# Patient Record
Sex: Male | Born: 1940 | Race: White | Hispanic: No | Marital: Married | Smoking: Never smoker
Health system: Southern US, Community
[De-identification: ages and names within clinical notes are randomized; demographics above are authoritative.]

## PROBLEM LIST (undated history)

## (undated) DIAGNOSIS — K219 Gastro-esophageal reflux disease without esophagitis: Secondary | ICD-10-CM

## (undated) DIAGNOSIS — C61 Malignant neoplasm of prostate: Secondary | ICD-10-CM

## (undated) DIAGNOSIS — F419 Anxiety disorder, unspecified: Secondary | ICD-10-CM

## (undated) DIAGNOSIS — I10 Essential (primary) hypertension: Secondary | ICD-10-CM

## (undated) DIAGNOSIS — C801 Malignant (primary) neoplasm, unspecified: Secondary | ICD-10-CM

## (undated) DIAGNOSIS — I2510 Atherosclerotic heart disease of native coronary artery without angina pectoris: Secondary | ICD-10-CM

## (undated) DIAGNOSIS — I2699 Other pulmonary embolism without acute cor pulmonale: Secondary | ICD-10-CM

## (undated) DIAGNOSIS — Z720 Tobacco use: Secondary | ICD-10-CM

## (undated) HISTORY — PX: INGUINAL HERNIA REPAIR: SUR1180

## (undated) HISTORY — PX: CHOLECYSTECTOMY: SHX55

## (undated) HISTORY — PX: OTHER SURGICAL HISTORY: SHX169

## (undated) HISTORY — DX: Malignant neoplasm of prostate: C61

## (undated) HISTORY — PX: KIDNEY STONE SURGERY: SHX686

---

## 2011-12-11 ENCOUNTER — Encounter: Payer: Self-pay | Admitting: Cardiology

## 2012-05-12 ENCOUNTER — Encounter (HOSPITAL_COMMUNITY)
Admission: EM | Disposition: A | Discharge status: Home / Self Care | Payer: Self-pay | Source: Other Acute Inpatient Hospital | Attending: Cardiology

## 2012-05-12 ENCOUNTER — Inpatient Hospital Stay (HOSPITAL_COMMUNITY)
Admission: EM | Admit: 2012-05-12 | Discharge: 2012-05-19 | DRG: 249 | Disposition: A | Discharge status: Home / Self Care | Payer: Medicare Other | Source: Other Acute Inpatient Hospital | Attending: Cardiology | Admitting: Cardiology

## 2012-05-12 ENCOUNTER — Encounter: Payer: Self-pay | Admitting: Cardiology

## 2012-05-12 ENCOUNTER — Encounter (HOSPITAL_COMMUNITY): Payer: Self-pay | Admitting: Nurse Practitioner

## 2012-05-12 DIAGNOSIS — F17200 Nicotine dependence, unspecified, uncomplicated: Secondary | ICD-10-CM | POA: Diagnosis present

## 2012-05-12 DIAGNOSIS — Z86711 Personal history of pulmonary embolism: Secondary | ICD-10-CM

## 2012-05-12 DIAGNOSIS — Z7901 Long term (current) use of anticoagulants: Secondary | ICD-10-CM

## 2012-05-12 DIAGNOSIS — I2119 ST elevation (STEMI) myocardial infarction involving other coronary artery of inferior wall: Principal | ICD-10-CM

## 2012-05-12 DIAGNOSIS — Z955 Presence of coronary angioplasty implant and graft: Secondary | ICD-10-CM

## 2012-05-12 DIAGNOSIS — Z8589 Personal history of malignant neoplasm of other organs and systems: Secondary | ICD-10-CM

## 2012-05-12 DIAGNOSIS — K219 Gastro-esophageal reflux disease without esophagitis: Secondary | ICD-10-CM | POA: Diagnosis present

## 2012-05-12 DIAGNOSIS — I2510 Atherosclerotic heart disease of native coronary artery without angina pectoris: Secondary | ICD-10-CM | POA: Diagnosis present

## 2012-05-12 DIAGNOSIS — R079 Chest pain, unspecified: Secondary | ICD-10-CM

## 2012-05-12 DIAGNOSIS — F411 Generalized anxiety disorder: Secondary | ICD-10-CM | POA: Diagnosis present

## 2012-05-12 DIAGNOSIS — I2782 Chronic pulmonary embolism: Secondary | ICD-10-CM | POA: Diagnosis present

## 2012-05-12 HISTORY — DX: Gastro-esophageal reflux disease without esophagitis: K21.9

## 2012-05-12 HISTORY — DX: Tobacco use: Z72.0

## 2012-05-12 HISTORY — PX: LEFT HEART CATH: SHX5478

## 2012-05-12 HISTORY — DX: Atherosclerotic heart disease of native coronary artery without angina pectoris: I25.10

## 2012-05-12 HISTORY — DX: Malignant (primary) neoplasm, unspecified: C80.1

## 2012-05-12 HISTORY — DX: Other pulmonary embolism without acute cor pulmonale: I26.99

## 2012-05-12 HISTORY — PX: PERCUTANEOUS CORONARY STENT INTERVENTION (PCI-S): SHX5485

## 2012-05-12 HISTORY — DX: Anxiety disorder, unspecified: F41.9

## 2012-05-12 LAB — BASIC METABOLIC PANEL
BUN: 16 mg/dL (ref 6–23)
CO2: 20 mEq/L (ref 19–32)
Calcium: 7.1 mg/dL — ABNORMAL LOW (ref 8.4–10.5)
GFR calc non Af Amer: 80 mL/min — ABNORMAL LOW (ref >90)
Glucose, Bld: 83 mg/dL (ref 70–99)

## 2012-05-12 LAB — TROPONIN I: Troponin I: 13.85 ng/mL (ref <0.30)

## 2012-05-12 LAB — CK TOTAL AND CKMB (NOT AT ARMC)
CK, MB: 56.8 ng/mL (ref 0.3–4.0)
Relative Index: 11.1 — ABNORMAL HIGH (ref 0.0–2.5)
Total CK: 514 U/L — ABNORMAL HIGH (ref 7–232)

## 2012-05-12 LAB — ABO/RH: ABO/RH(D): O POS

## 2012-05-12 LAB — MAGNESIUM: Magnesium: 1.6 mg/dL (ref 1.5–2.5)

## 2012-05-12 LAB — TYPE AND SCREEN: ABO/RH(D): O POS

## 2012-05-12 LAB — MRSA PCR SCREENING: MRSA by PCR: NEGATIVE

## 2012-05-12 SURGERY — LEFT HEART CATH
Anesthesia: LOCAL | Site: Groin | Laterality: Right

## 2012-05-12 MED ORDER — NITROGLYCERIN 0.2 MG/ML ON CALL CATH LAB
INTRAVENOUS | Status: AC
  Filled 2012-05-12: qty 1

## 2012-05-12 MED ORDER — SODIUM CHLORIDE 0.9 % IV SOLN: 250.0000 mL | INTRAVENOUS | Status: DC | PRN

## 2012-05-12 MED ORDER — ATORVASTATIN CALCIUM 80 MG PO TABS
80.0000 mg | ORAL_TABLET | Freq: Every day | ORAL | Status: DC
  Administered 2012-05-13 – 2012-05-18 (×6): 80 mg via ORAL
  Filled 2012-05-12 (×7): qty 1

## 2012-05-12 MED ORDER — ASPIRIN 81 MG PO CHEW
CHEWABLE_TABLET | ORAL | Status: AC
  Filled 2012-05-12: qty 2

## 2012-05-12 MED ORDER — LIDOCAINE HCL (PF) 1 % IJ SOLN
INTRAMUSCULAR | Status: AC
  Filled 2012-05-12: qty 30

## 2012-05-12 MED ORDER — ONDANSETRON HCL 4 MG/2ML IJ SOLN: 4.0000 mg | Freq: Four times a day (QID) | INTRAMUSCULAR | Status: DC | PRN

## 2012-05-12 MED ORDER — DOPAMINE-DEXTROSE 3.2-5 MG/ML-% IV SOLN
INTRAVENOUS | Status: AC
  Filled 2012-05-12: qty 250

## 2012-05-12 MED ORDER — MIDAZOLAM HCL 2 MG/2ML IJ SOLN
INTRAMUSCULAR | Status: AC
  Filled 2012-05-12: qty 2

## 2012-05-12 MED ORDER — ACETAMINOPHEN 325 MG PO TABS
650.0000 mg | ORAL_TABLET | ORAL | Status: DC | PRN
  Administered 2012-05-17: 650 mg via ORAL
  Filled 2012-05-12: qty 2

## 2012-05-12 MED ORDER — SODIUM CHLORIDE 0.9 % IJ SOLN
3.0000 mL | Freq: Two times a day (BID) | INTRAMUSCULAR | Status: DC
  Administered 2012-05-13 – 2012-05-18 (×12): 3 mL via INTRAVENOUS

## 2012-05-12 MED ORDER — HEPARIN (PORCINE) IN NACL 2-0.9 UNIT/ML-% IJ SOLN
INTRAMUSCULAR | Status: AC
  Filled 2012-05-12: qty 1000

## 2012-05-12 MED ORDER — SODIUM CHLORIDE 0.9 % IJ SOLN: 3.0000 mL | INTRAMUSCULAR | Status: DC | PRN

## 2012-05-12 MED ORDER — CLOPIDOGREL BISULFATE 75 MG PO TABS
75.0000 mg | ORAL_TABLET | Freq: Every day | ORAL | Status: DC
Start: 2012-05-13 — End: 2012-05-19
  Administered 2012-05-13 – 2012-05-19 (×7): 75 mg via ORAL
  Filled 2012-05-12 (×8): qty 1

## 2012-05-12 MED ORDER — ACETAMINOPHEN 325 MG PO TABS: 650.0000 mg | ORAL_TABLET | ORAL | Status: DC | PRN

## 2012-05-12 MED ORDER — BIVALIRUDIN 250 MG IV SOLR
INTRAVENOUS | Status: AC
  Filled 2012-05-12: qty 250

## 2012-05-12 MED ORDER — CLOPIDOGREL BISULFATE 300 MG PO TABS
ORAL_TABLET | ORAL | Status: AC
  Administered 2012-05-13: 75 mg via ORAL
  Filled 2012-05-12: qty 2

## 2012-05-12 MED ORDER — ALPRAZOLAM 0.5 MG PO TABS: 1.0000 mg | ORAL_TABLET | Freq: Two times a day (BID) | ORAL | Status: DC | PRN

## 2012-05-12 MED ORDER — PANTOPRAZOLE SODIUM 40 MG PO TBEC
40.0000 mg | DELAYED_RELEASE_TABLET | Freq: Every day | ORAL | Status: DC
  Administered 2012-05-14 – 2012-05-19 (×6): 40 mg via ORAL
  Filled 2012-05-12 (×7): qty 1

## 2012-05-12 MED ORDER — MORPHINE SULFATE 2 MG/ML IJ SOLN
2.0000 mg | INTRAMUSCULAR | Status: DC | PRN
  Administered 2012-05-12 – 2012-05-15 (×8): 2 mg via INTRAVENOUS
  Filled 2012-05-12 (×8): qty 1

## 2012-05-12 MED ORDER — ATROPINE SULFATE 1 MG/ML IJ SOLN
INTRAMUSCULAR | Status: AC
  Filled 2012-05-12: qty 1

## 2012-05-12 MED ORDER — ASPIRIN EC 81 MG PO TBEC
81.0000 mg | DELAYED_RELEASE_TABLET | Freq: Every day | ORAL | Status: DC
Start: 2012-05-13 — End: 2012-05-19
  Administered 2012-05-13 – 2012-05-19 (×7): 81 mg via ORAL
  Filled 2012-05-12 (×8): qty 1

## 2012-05-12 MED ORDER — NITROGLYCERIN 0.4 MG SL SUBL: 0.4000 mg | SUBLINGUAL_TABLET | SUBLINGUAL | Status: DC | PRN

## 2012-05-12 MED ORDER — ASPIRIN 81 MG PO CHEW: 81.0000 mg | CHEWABLE_TABLET | Freq: Every day | ORAL | Status: DC

## 2012-05-12 MED ORDER — SODIUM CHLORIDE 0.9 % IV SOLN
INTRAVENOUS | Status: AC
  Administered 2012-05-12: 22:00:00 via INTRAVENOUS

## 2012-05-12 NOTE — CV Procedure (Addendum)
Cardiac Catheterization Procedure Note  Name: Anthony Dodson MRN: [id number] DOB: 1941-04-20  Procedure: Left Heart Cath, Selective Coronary Angiography, LV angiography,  PTCA/Stent of RCA  Indication: inferior MI   Diagnostic Procedure Details:  The patient was prepped for a right radial.  The left could not be used due to reconstructive surgery.  The right pleth was negative with no signal in the thumb with occlusion of the radial.  The radial approach was thus abandoned.  The right femoral head was identified by fluoro and careful attention to entry technique utilized.  The right groin was prepped, draped, and anesthetized with 1% lidocaine. Using access with a SMART NEEDLE and anterior only puncture, a 6 French sheath was introduced into the right femoral artery. Standard Judkins catheters were used for selective coronary angiography. Catheter exchanges were performed over a wire.   PCI Procedure Note:  Following the diagnostic procedure, the decision was made to proceed with PCI. Marland Kitchen Weight-based bivalirudin was given for anticoagulation. ACT was not checked as the patient is on warfarin. A 6 French JR4 [facility] guide catheter was inserted.  A prowater coronary guidewire was used to cross the lesion.  The lesion was predilated with a 2.0 by 15mm balloon.  The lesion was then stented with a 2.84mm by 20 Veriflex non DES stent, as the patient is on chronic warfarin anticoagulation.  The stent was postdilated with a 3.63mm noncompliant balloon to 15 atm.  Following PCI, there was 0% residual stenosis and TIMI-3 flow. Final angiography confirmed an excellent result. Femoral hemostasis was achieved with a 49F Angioseal device after re-prepping the groin with chlorhexidine and exchaning gloves.  Femoral angio conferred an excellent insertion location before deployment.  There was excellent hemostasis. The patient tolerated the PCI procedure well. There were no immediate procedural complications.  The patient was  transferred to the post catheterization recovery area for further monitoring.  During the procedure he was hypotensive, with partial reversal with atropine, but IV fluids and dopamine were administered for presumed RV infarction.  With vigorous fluid rescucitation, the BP rose to the 130s and stabilized.  The dopamine was stopped, and fluids brought to 200cc per hour after the LVEDP demonstrated a low filling pressure.         PROCEDURAL FINDINGS Hemodynamics: AO 127/78 (100) LV 95/6/11 No gradient on pullback.    Coronary angiography: Coronary dominance: right  Left mainstem:  Calcified, very short and without obstruction  Left anterior descending (LAD): Courses to the apex.  Diffuse 50% proximal disease that is calcified and segmental.  Then there is an 80% focal lesion.  Distally there is a 70% segmental area of disease.  The distal LAD wraps the apex.    Left circumflex (LCx): provides a large OM1 with 95% ostial narrowing, and large AV circ that supplies a large second OM. The latter is without significant focal narrowing.    Right coronary artery (RCA): totally occluded just beyond the ostium.  Following opening there was a tight lesion proximally that was stented with a non DES stent, 20mm in length, with a 2.75 stent taken to 3.3, with restoration of TIMI3 flow from TIMI 0 flow.  The lesion was reduced to 0%.  The RCA had a moderate PDA, smaller PLA 1 and very large PLA 2 that coursed well out to the apical lateral wall.  The PDA had segmental 40% proximally, and the AV groove continuation branch hasd 30% narrowing.  There was some collateralization prior to reperfusion.  Left ventriculography: Left ventricular systolic function is mildly reduced with moderate inferior hypokinesis. LVEF is estimated at 50%, and there is no significant mitral regurgitation.  Femoral angiography revealed no significant atherosclerotic change.      PCI Data: Vessel - RCA/Segment - 1 Percent  Stenosis (pre)  100 TIMI-flow 0 Stent Veriflex non DES 2.74mm by 20mm post dil to 3.30mm Percent Stenosis (post) 0 TIMI-flow (post) 3  Final Conclusions:   1.  Inferior wall MI with probable RV infarction due to total proximal occlusion of the RCA with successful myocardial reperfusion. 2.  Residual significant disease of the LAD and OM1 as noted.    Recommendations:  1.  ASA/Plavix 2.. Plavix and warfarin to continue.  Start warfarin back when INR is 1.6 to 1.8 3.  Review films with team to discuss options of treatment.  May need PCI or CABG.    Trisha Morandi 05/12/2012, 9:05 PM

## 2012-05-12 NOTE — H&P (Signed)
Patient ID: Anthony Dodson MRN: [id number], DOB/AGE: 1941/02/04   Admit date: 05/12/2012   Primary Physician: Juanito Doom - [place] [facility], [facility] Primary Cardiologist: New to [place] - likely to f/u in [place].  Pt. Profile:  71 y/o male w/o prior cardiac hx who presents with acute inferior STEMI.  Problem List  Past Medical History  Diagnosis Date  . 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 GERD (gastroesophageal reflux disease)   . Anxiety   . Cancer     a. 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   Allergies  Allergies no known allergies  HPI  71 y/o male without prior cardiac hx.  As noted above, he does have a h/o recurrent PE and is chronic coumadin anticoagulation.  He was in his USOH until this evening @ approx 17:30 when he was out in his yard and had sudden onset of severe chest tightness associated with sob and nausea.  He had trouble making it back to his house but when he did, he says he could barely breathe.  His fiancee called EMS and he was taken to [facility] ED.  There, he was found to have inferior ST elevation with reciprocal changes.  Code STEMI was called.  Labs were sent off - INR 2.5, creat 1.35.  Initial CE were negative.  He was transported to [facility] for further eval.  Upon arrival to the cath lab, he complains of 6/10 chest pain.  He is hemodynamically stable and cath is ongoing.  Home Medications  Pt says that he takes:  -Xanax 1mg  qhs and occasionally during the day on a prn basis. -coumadin -prilosec  Family History  Family History  Problem Relation Age of Onset  . Lung disease Father     died in his 38's of black lung  . Stroke Mother     died in her 43's.   Social History  History   Social History  . Marital Status: Divorced    Spouse Name: N/A    Number of Children: N/A  . Years of Education: N/A   Occupational History  . Not on file.   Social  History Main Topics  . Smoking status: Never Smoker   . 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 Alcohol Use: No  . Drug Use: No  . Sexually Active: Yes   Other Topics Concern  . Not on file   Social History Narrative   Lives in [place], [place] with fiance.  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     Review of Systems General:  No chills, fever, night sweats or weight changes.  Cardiovascular:  +++ chest pain, dyspnea, and nausea as outlined above.  No edema, orthopnea, palpitations, paroxysmal nocturnal dyspnea. Dermatological: No rash, lesions/masses Respiratory: No cough, dyspnea Urologic: No hematuria, dysuria Abdominal:   No nausea, vomiting, diarrhea, bright red blood per rectum, melena, or hematemesis Neurologic:  No visual changes, wkns, changes in mental status. All other systems reviewed and are otherwise negative except as noted above.  Physical Exam  There were no vitals taken for this visit.  General: Pleasant, NAD Psych: Normal affect. Neuro: Alert and oriented X 3. Moves all extremities spontaneously. HEENT: Normal  Neck: Supple without bruits or JVD. Lungs:  Resp regular and unlabored, CTA. Heart: RRR no s3, s4,  or murmurs. Abdomen: Soft, non-tender, non-distended, BS + x 4.  Extremities: No clubbing, cyanosis or edema. DP/PT/Radials 2+ and equal bilaterally.  Reverse Allen's on right arm is abnl with NO ulnar pleth waveform noted.  Labs  All labs pending.   Radiology/Studies  No results found.  ECG  SB, 59, 1mm inf st elevation with ant/lat st dep/t changes.  ASSESSMENT AND PLAN  1.  Acute inferior STEMI/CAD:  Cath/PCI ongoing.  Preliminarily shows occluded proximal RCA.  Plan to add asa, p2y12 inhibitor, bb (as tolerated - currently experiencing intermittent bradycardia req atropine), high dose statin.  Eventual cardiac rehab.  Pt had no right ulnar waveform and thus femoral  access was obtained.  Will have to watch for bleeding closely in setting of therapeutic INR.  2.  GERD:  Add protonix.  3.  Lipids:  Unknown status.  Check lipids/lft's.  Add high dose statin.  4.  Recurrent Pulmonary Emboli:  Will need to be placed back on coumadin ASAP.  INR currently therapeutic.  Signed, Nicolasa Ducking, NP 05/12/2012, 7:52 PM  History as noted above.  Presentation with acute inferior MI.  Repeat ECG done here to confirm need.  ECG here consistent with acute MI.  Patient seen and with continued pain cath recommended.  History reviewed in detail and patient examined.  Pleth on right revealed no flow to thumb with radial occluded.  Unable to use left arm due to reconstructive surgery.  Therefore femoral approach utilized.

## 2012-05-12 NOTE — Progress Notes (Signed)
Chaplain Note:  Chaplain responded to page to Cath Lab team.  Chaplain provided spiritual comfort and support for pt and staff.  There was no family present.  Pt and staff expressed appreciation for chaplain support.  Chaplain will follow up as needed.  05/12/12 2214  Clinical Encounter Type  Visited With Patient  Visit Type Spiritual support  Referral From Other (Comment) (Code STEMI page)  Spiritual Encounters  Spiritual Needs Emotional  Stress Factors  Patient Stress Factors Health changes;Major life changes  Family Stress Factors None identified (No family present)  Advance Directives (For Healthcare)  Advance Directive Patient has advance directive, copy not in chart  Advance Directive not in Chart Copy requested from family  Pre-existing out of facility DNR order (yellow form or pink MOST form) No   Verdie Shire, Chaplain  [phone]

## 2012-05-13 DIAGNOSIS — Z8589 Personal history of malignant neoplasm of other organs and systems: Secondary | ICD-10-CM

## 2012-05-13 DIAGNOSIS — I2782 Chronic pulmonary embolism: Secondary | ICD-10-CM | POA: Diagnosis present

## 2012-05-13 DIAGNOSIS — I219 Acute myocardial infarction, unspecified: Secondary | ICD-10-CM

## 2012-05-13 DIAGNOSIS — I2119 ST elevation (STEMI) myocardial infarction involving other coronary artery of inferior wall: Secondary | ICD-10-CM | POA: Diagnosis present

## 2012-05-13 LAB — BASIC METABOLIC PANEL
CO2: 23 mEq/L (ref 19–32)
Chloride: 108 mEq/L (ref 96–112)
Creatinine, Ser: 1.19 mg/dL (ref 0.50–1.35)
GFR calc Af Amer: 69 mL/min — ABNORMAL LOW (ref >90)
Potassium: 4.6 mEq/L (ref 3.5–5.1)

## 2012-05-13 LAB — LIPID PANEL
LDL Cholesterol: 60 mg/dL (ref 0–99)
VLDL: 57 mg/dL — ABNORMAL HIGH (ref 0–40)

## 2012-05-13 LAB — CBC
MCV: 93.5 fL (ref 78.0–100.0)
Platelets: 165 10*3/uL (ref 150–400)
RBC: 4.14 MIL/uL — ABNORMAL LOW (ref 4.22–5.81)
WBC: 7.8 10*3/uL (ref 4.0–10.5)

## 2012-05-13 LAB — PROTIME-INR
INR: 2.79 — ABNORMAL HIGH (ref 0.00–1.49)
Prothrombin Time: 28 seconds — ABNORMAL HIGH (ref 11.6–15.2)

## 2012-05-13 LAB — CK TOTAL AND CKMB (NOT AT ARMC)
CK, MB: 252.6 ng/mL (ref 0.3–4.0)
CK, MB: 224.9 ng/mL (ref 0.3–4.0)
Relative Index: 12.7 — ABNORMAL HIGH (ref 0.0–2.5)
Relative Index: 11.6 — ABNORMAL HIGH (ref 0.0–2.5)

## 2012-05-13 LAB — HEMOGLOBIN A1C: Mean Plasma Glucose: 128 mg/dL — ABNORMAL HIGH (ref <117)

## 2012-05-13 MED ORDER — PERFLUTREN LIPID MICROSPHERE
1.0000 mL | INTRAVENOUS | Status: AC | PRN
  Administered 2012-05-13: 2 mL via INTRAVENOUS
  Filled 2012-05-13: qty 10

## 2012-05-13 MED ORDER — PERFLUTREN LIPID MICROSPHERE
INTRAVENOUS | Status: AC
  Administered 2012-05-13: 2 mL via INTRAVENOUS
  Filled 2012-05-13: qty 10

## 2012-05-13 MED FILL — Dextrose Inj 5%: INTRAVENOUS | Qty: 50 | Status: AC

## 2012-05-13 NOTE — Progress Notes (Signed)
Echocardiogram 2D Echocardiogram has been performed.  Anthony Dodson 05/13/2012, 11:34 AM

## 2012-05-13 NOTE — Progress Notes (Signed)
Subjective:  No chest pain.  Feeling ok.  Groin ok.    Objective:  Vital Signs in the last 24 hours: Temp:  [98 F (36.7 C)-98.2 F (36.8 C)] 98 F (36.7 C) (10/02 0400) Pulse Rate:  [50-92] 51  (10/02 0700) Resp:  [11-18] 18  (10/02 0700) BP: (92-113)/(46-77) 99/46 mmHg (10/02 0700) SpO2:  [96 %-100 %] 98 % (10/02 0700) Weight:  [172 lb 9.9 oz (78.3 kg)] 172 lb 9.9 oz (78.3 kg) (10/01 2211)  Intake/Output from previous day: 10/01 0701 - 10/02 0700 In: 1000 [I.V.:1000] Out: 450 [Urine:450]   Physical Exam: General: Well developed, well nourished, in no acute distress. Head:  Normocephalic and atraumatic. Lungs: Clear to auscultation and percussion. Heart: Normal S1 and S2.  S4 gallop Pulses: Pulses normal in all 4 extremities.  Angioseal site clean and without hematoma Extremities: No clubbing or cyanosis. No edema. Neurologic: Alert and oriented x 3.    Lab Results:  [facility] 05/13/12 0311  WBC 7.8  HGB 12.5*  PLT 165    Basename 05/13/12 0311 05/12/12 2143  NA 140 143  K 4.6 3.5  CL 108 115*  CO2 23 20  GLUCOSE 111* 83  BUN 18 16  CREATININE 1.19 0.99    Basename 05/13/12 0311 05/12/12 2144  TROPONINI >20.00* 13.85*   Hepatic Function Panel No results found for this basename: PROT,ALBUMIN,AST,ALT,ALKPHOS,BILITOT,BILIDIR,IBILI in the last 72 hours  Basename 05/13/12 0311  CHOL 141   No results found for this basename: PROTIME in the last 72 hours  Imaging: No results found.  EKG:  Inferior MI, age uncertain.  STE resolved.    Cardiac Studies:  See troponins  Assessment/Plan:  1.  Inferior MI, sp PCI 2.  History of multiple PEs in past 3.  Prior head and neck cancer.  4.  Elevated INR due to warfarin. 5.  Multiple vessel CAD.    Plan 1.  Hold warfarin and allow INR to drift down. 2.  Restart heparin when less than 2.0 3.  Consider other options with regard to treatment of multivessel CAD  Need more information regarding cancer.  Will try  to get records from [facility].        Shawnie Pons, MD, [facility], FSCAI 05/13/2012, 8:17 AM

## 2012-05-13 NOTE — Progress Notes (Signed)
[id number] Came to see pt. Was going to get OOB to chair but pt stated that he was to remain in bed. Stated cardiologist wanted him to keep groin quiet due to coumadin. Began ed with pt and fiancee. Reviewed MI restrictions, NTG use and stent. Left MI booklet. Will followup tomorrow for activity. Please address activity. Tyrian Peart DunlapRN

## 2012-05-13 NOTE — Care Management Note (Signed)
  Page 1 of 1   05/13/2012     4:45:33 PM   CARE MANAGEMENT NOTE 05/13/2012  Patient:  Anthony Dodson, Anthony Dodson   Account Number:  [id number]  Date Initiated:  05/13/2012  Documentation initiated by:  Alvira Philips Assessment:   71 yr-old male adm with dx of STEMI; lives with fiance, independent PTA      DC Planning Services  CM consult   Per UR Regulation:  Reviewed for med. necessity/level of care/duration of stay  Comments:  Primary care with Quentin Mulling NP @ [facility]

## 2012-05-14 DIAGNOSIS — Z8589 Personal history of malignant neoplasm of other organs and systems: Secondary | ICD-10-CM

## 2012-05-14 MED ORDER — ALPRAZOLAM 0.5 MG PO TABS
0.5000 mg | ORAL_TABLET | Freq: Three times a day (TID) | ORAL | Status: DC | PRN
  Administered 2012-05-14 – 2012-05-18 (×5): 0.5 mg via ORAL
  Filled 2012-05-14 (×5): qty 1

## 2012-05-14 NOTE — Progress Notes (Signed)
Subjective:  Apparently sees a Dr. Arlyn Leak in [place] due to history of PE.  We do not have any data surrounding his MI  Objective:  Vital Signs in the last 24 hours: Temp:  [98.5 F (36.9 C)-99.5 F (37.5 C)] 98.9 F (37.2 C) (10/03 0800) Pulse Rate:  [52-70] 64  (10/03 0800) Resp:  [7-19] 14  (10/03 0800) BP: (91-119)/(52-70) 119/67 mmHg (10/03 0800) SpO2:  [91 %-97 %] 96 % (10/03 0800) Weight:  [173 lb 15.1 oz (78.9 kg)] 173 lb 15.1 oz (78.9 kg) (10/03 0500)  Intake/Output from previous day: 10/02 0701 - 10/03 0700 In: 1320 [P.O.:1320] Out: 2300 [Urine:2300]   Physical Exam: General: Well developed, well nourished, in no acute distress. Head:  Normocephalic .  Prior neck surgery.   Lungs: Clear to auscultation and percussion. Heart: Normal S1 and S2.  No murmur, rubs or gallops.  Pulses: Pulses normal in all 4 extremities.  Groin looks great.   Extremities: No clubbing or cyanosis. No edema. Neurologic: Alert and oriented x 3.    Lab Results:  [facility] 05/13/12 0311  WBC 7.8  HGB 12.5*  PLT 165    Basename 05/13/12 0311 05/12/12 2143  NA 140 143  K 4.6 3.5  CL 108 115*  CO2 23 20  GLUCOSE 111* 83  BUN 18 16  CREATININE 1.19 0.99    Basename 05/13/12 0915 05/13/12 0311  TROPONINI >20.00* >20.00*   Hepatic Function Panel No results found for this basename: PROT,ALBUMIN,AST,ALT,ALKPHOS,BILITOT,BILIDIR,IBILI in the last 72 hours  Basename 05/13/12 0311  CHOL 141   No results found for this basename: PROTIME in the last 72 hours  Imaging: No results found.    Assessment/Plan:  Patient Active Hospital Problem List: ST elevation myocardial infarction (STEMI) of inferoposterior wall (05/13/2012)   Assessment: slow improvement overall   Plan: begin to ambulate and move forward.  Will defer decision on second lesions depending on INR Chronic pulmonary embolism (05/13/2012)   Assessment: continue heparin when INR less than 2   Plan: repeat INR  tomorrow History of head or neck cancer (05/13/2012)   Assessment: attempt to contact Dr. Arlyn Leak regarding HN CA   Plan: as above.        Shawnie Pons, MD, [facility], FSCAI 05/14/2012, 9:14 AM

## 2012-05-14 NOTE — Progress Notes (Signed)
CARDIAC REHAB PHASE I   PRE:  Rate/Rhythm: 58 SB    BP: sitting 116/68    SaO2:   MODE:  Ambulation: 140 ft   POST:  Rate/Rhythm: 70 SR    BP: sitting 121/31     SaO2: 96 RA  Pt c/o groin soreness. Able to stand independently and walk with RW slowly/gingerly. Denied CP/SOB. Return to recliner after fairly short distance. VSS. Will f/u tomorrow.  [id number]  Harriet Masson CES, ACSM

## 2012-05-15 LAB — CBC
HCT: 38.7 % — ABNORMAL LOW (ref 39.0–52.0)
Hemoglobin: 12.6 g/dL — ABNORMAL LOW (ref 13.0–17.0)
MCH: 30.2 pg (ref 26.0–34.0)
MCHC: 32.6 g/dL (ref 30.0–36.0)
RBC: 4.17 MIL/uL — ABNORMAL LOW (ref 4.22–5.81)

## 2012-05-15 LAB — BASIC METABOLIC PANEL
BUN: 18 mg/dL (ref 6–23)
CO2: 25 mEq/L (ref 19–32)
GFR calc non Af Amer: 56 mL/min — ABNORMAL LOW (ref >90)
Glucose, Bld: 99 mg/dL (ref 70–99)
Potassium: 3.8 mEq/L (ref 3.5–5.1)
Sodium: 143 mEq/L (ref 135–145)

## 2012-05-15 LAB — PROTIME-INR
INR: 1.73 — ABNORMAL HIGH (ref 0.00–1.49)
Prothrombin Time: 19.7 seconds — ABNORMAL HIGH (ref 11.6–15.2)

## 2012-05-15 MED ORDER — WARFARIN - PHARMACIST DOSING INPATIENT: Freq: Every day | Status: DC

## 2012-05-15 MED ORDER — WARFARIN SODIUM 6 MG PO TABS
6.0000 mg | ORAL_TABLET | Freq: Once | ORAL | Status: AC
  Administered 2012-05-15: 6 mg via ORAL
  Filled 2012-05-15: qty 1

## 2012-05-15 NOTE — Progress Notes (Signed)
   SUBJECTIVE:  No chest pain.  No SOB   PHYSICAL EXAM Filed Vitals:   05/14/12 1135 05/14/12 1357 05/14/12 2100 05/15/12 0500  BP: 138/83 116/72 119/72 105/67  Pulse: 61 63 60 58  Temp: 98.5 F (36.9 C) 98.7 F (37.1 C) 98.3 F (36.8 C) 98.2 F (36.8 C)  TempSrc:      Resp: 18 18 18 18   Height:      Weight:    78.926 kg (174 lb)  SpO2: 98% 96% 95% 96%   General:  No distress Lungs:  Clear Heart:  RRR Abdomen:   Positive bowel sounds, no rebound no guarding Extremities:  Right groin without hematoma.  LABS: Lab Results  Component Value Date   CKTOTAL 1945* 05/13/2012   CKMB 224.9* 05/13/2012   TROPONINI >20.00* 05/13/2012   Results for orders placed during the hospital encounter of 05/12/12 (from the past 24 hour(s))  PROTIME-INR     Status: Abnormal   Collection Time   05/15/12  5:15 AM      Component Value Range   Prothrombin Time 19.7 (*) 11.6 - 15.2 seconds   INR 1.73 (*) 0.00 - 1.49  CBC     Status: Abnormal   Collection Time   05/15/12  5:15 AM      Component Value Range   WBC 6.7  4.0 - 10.5 K/uL   RBC 4.17 (*) 4.22 - 5.81 MIL/uL   Hemoglobin 12.6 (*) 13.0 - 17.0 g/dL   HCT 40.9 (*) 81.1 - 91.4 %   MCV 92.8  78.0 - 100.0 fL   MCH 30.2  26.0 - 34.0 pg   MCHC 32.6  30.0 - 36.0 g/dL   RDW 78.2  95.6 - 21.3 %   Platelets 150  150 - 400 K/uL  BASIC METABOLIC PANEL     Status: Abnormal   Collection Time   05/15/12  5:15 AM      Component Value Range   Sodium 143  135 - 145 mEq/L   Potassium 3.8  3.5 - 5.1 mEq/L   Chloride 108  96 - 112 mEq/L   CO2 25  19 - 32 mEq/L   Glucose, Bld 99  70 - 99 mg/dL   BUN 18  6 - 23 mg/dL   Creatinine, Ser 0.86  0.50 - 1.35 mg/dL   Calcium 9.1  8.4 - 57.8 mg/dL   GFR calc non Af Amer 56 (*) >90 mL/min   GFR calc Af Amer 65 (*) >90 mL/min    Intake/Output Summary (Last 24 hours) at 05/15/12 0819 Last data filed at 05/15/12 0814  Gross per 24 hour  Intake    720 ml  Output    500 ml  Net    220 ml    EKG:     ASSESSMENT AND PLAN:  ST elevation myocardial infarction (STEMI) of inferoposterior wall:  Discussed with Dr. Riley Kill.  Medical management of remaining lesions.  I will restart warfarin.  OK to discharge in AM.  I will follow him in [place].    Chronic pulmonary embolism:  Restart warfarin as above  History of head or neck cancer:  He will follow with his primary provider.    Fayrene Fearing [facility] 05/15/2012 8:19 AM

## 2012-05-15 NOTE — Progress Notes (Signed)
CARDIAC REHAB PHASE I   PRE:  Rate/Rhythm: 57 SB    BP: sitting 110/60    SaO2:   MODE:  Ambulation: 510 ft   POST:  Rate/Rhythm: 83 SR    BP: sitting 94/50     SaO2:   Tolerated well. Groin feels better this pm. Able to walk without RW without problems. No CP, etc. BP lower after walk but denied dizziness. Ed completed. Pt loves sugar. Discussed this. Requests his name be sent to [facility] but does seem reluctant to do program.  [id number]  Harriet Masson CES, ACSM

## 2012-05-15 NOTE — Progress Notes (Signed)
ANTICOAGULATION CONSULT NOTE - Initial Consult  Pharmacy Consult for coumadin  Indication: pulmonary embolus  No Known Allergies  Patient Measurements: Height: 5\' 10"  (177.8 cm) Weight: 174 lb (78.926 kg) IBW/kg (Calculated) : 73  Heparin Dosing Weight:  Vital Signs: Temp: 98.2 F (36.8 C) (10/04 0500) BP: 105/67 mmHg (10/04 0500) Pulse Rate: 58  (10/04 0500)  Labs:  Alvira Philips 05/15/12 0515 05/14/12 0508 05/13/12 0915 05/13/12 0311 05/12/12 2144 05/12/12 2143  HGB 12.6* -- -- 12.5* -- --  HCT 38.7* -- -- 38.7* -- --  PLT 150 -- -- 165 -- --  APTT -- -- -- -- -- --  LABPROT 19.7* 25.0* -- 28.0* -- --  INR 1.73* 2.39* -- 2.79* -- --  HEPARINUNFRC -- -- -- -- -- --  CREATININE 1.25 -- -- 1.19 -- 0.99  CKTOTAL -- -- 1945* 1988* 514* --  CKMB -- -- 224.9* 252.6* 56.8* --  TROPONINI -- -- >20.00* >20.00* 13.85* --    Estimated Creatinine Clearance: 55.2 ml/min (by C-G formula based on Cr of 1.25).   Medical History: Past Medical History  Diagnosis Date  . 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 GERD (gastroesophageal reflux disease)   . Anxiety   . Cancer     a. 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 ALPRAZolam (XANAX) 1 MG tablet Take 1 mg by mouth 2 (two) times daily as needed. For anxiety      . Cholecalciferol (VITAMIN D PO) Take 1 tablet by mouth daily.      Marland Kitchen GLUCOSAMINE PO Take 1 tablet by mouth daily. Hold while in hospital      . omeprazole (PRILOSEC) 40 MG capsule Take 40 mg by mouth daily.      . vitamin B-12 (CYANOCOBALAMIN) 1000 MCG tablet Take 1,000 mcg by mouth daily.      Marland Kitchen warfarin (COUMADIN) 4 MG tablet Take 4 mg by mouth daily.        Assessment:  71 yo man on coumadin for hx of recurrent PE, last 2012 admitted for MI.  He is s/p cath with successful stenting of RCA, and plan is for medical management of LAD/OM1.  Coumadin to be resumed today for hx PE.   His home dose of coumadin is 4 mg po daily.  His INR is 1.73 today after coumadin was held the past 2 days.  His INR was therapeutic at 2.79 on admission.   Goal of Therapy:  INR 2-3   Plan:  1. Coumadin 6 mg po x 1dose 2. Home dose is 4 mg po daily 3. Daily INR Herby Abraham, Pharm.D. [phone] 05/15/2012 11:07 AM

## 2012-05-15 NOTE — Progress Notes (Signed)
0910 Pt declined walk at this time. Stated he went to bathroom and groin now hurting. RN giving pain med now. Pt asked that we come back. Will followup after 1300 as fiancee should be here. Sequoya Hogsett DunlapRN

## 2012-05-16 LAB — PROTIME-INR
INR: 1.31 (ref 0.00–1.49)
Prothrombin Time: 16 seconds — ABNORMAL HIGH (ref 11.6–15.2)

## 2012-05-16 LAB — CBC
Hemoglobin: 12.8 g/dL — ABNORMAL LOW (ref 13.0–17.0)
RBC: 4.24 MIL/uL (ref 4.22–5.81)

## 2012-05-16 MED ORDER — ENOXAPARIN SODIUM 40 MG/0.4ML ~~LOC~~ SOLN
40.0000 mg | SUBCUTANEOUS | Status: DC
  Administered 2012-05-16 – 2012-05-19 (×4): 40 mg via SUBCUTANEOUS
  Filled 2012-05-16 (×5): qty 0.4

## 2012-05-16 MED ORDER — WARFARIN SODIUM 6 MG PO TABS
6.0000 mg | ORAL_TABLET | Freq: Once | ORAL | Status: AC
  Administered 2012-05-16: 6 mg via ORAL
  Filled 2012-05-16: qty 1

## 2012-05-16 NOTE — Progress Notes (Signed)
Subjective:  Stable.  No new symptoms.  Unfortunately, INR is down--hx of three recurrent PEs.  His girlfriend told us he still chews so we discussed this in detail.    Objective:  Vital Signs in the last 24 hours: Temp:  [98.1 F (36.7 C)-98.5 F (36.9 C)] 98.1 F (36.7 C) (10/05 0600) Pulse Rate:  [56-64] 56  (10/05 0600) Resp:  [15] 15  (10/05 0600) BP: (96-130)/(50-74) 96/50 mmHg (10/05 0600) SpO2:  [97 %] 97 % (10/05 0600)  Intake/Output from previous day: 10/04 0701 - 10/05 0700 In: 840 [P.O.:840] Out: -    Physical Exam: General: Well developed, well nourished, in no acute distress. Head:  Normocephalic.  Left facial surgery.   Lungs: Clear to auscultation and percussion. Heart: Normal S1 and S2.  No murmur, rubs or gallops.  Pulses: Pulses normal in all 4 extremities. Extremities: No clubbing or cyanosis. No edema. Neurologic: Alert and oriented x 3.    Lab Results:  Basename 05/16/12 0600 05/15/12 0515  WBC 6.4 6.7  HGB 12.8* 12.6*  PLT 158 150    Basename 05/15/12 0515  NA 143  K 3.8  CL 108  CO2 25  GLUCOSE 99  BUN 18  CREATININE 1.25   No results found for this basename: TROPONINI:2,CK,MB:2 in the last 72 hours Hepatic Function Panel No results found for this basename: PROT,ALBUMIN,AST,ALT,ALKPHOS,BILITOT,BILIDIR,IBILI in the last 72 hours No results found for this basename: CHOL in the last 72 hours No results found for this basename: PROTIME in the last 72 hours  Imaging: No results found.  INR 1.3  Assessment/Plan:  Patient Active Hospital Problem List: ST elevation myocardial infarction (STEMI) of inferoposterior wall (05/13/2012)   Assessment: much improved   Plan: has residual CAD but not ideal for PCI.  Dr. Excell Seltzer and I have reviewed films, and it looks best for now to consider primarily medical therapy Chronic pulmonary embolism (05/13/2012)   Assessment: will delay dc and give sub q lower dose lovenox  (because of recent femoral  cath), and give warfarin again.  Hopefully INR will go back up tomorrow.    Plan: as above History of head or neck cancer (05/13/2012)   Assessment: still using smokeless tobacco   Plan: Needs follow up and lives ten minutes from [facility] so will get him to get a follow up in cancer center at [facility] when he sees Dr. Antoine Poche in follow up.        Shawnie Pons, MD, [facility], FSCAI 05/16/2012, 9:57 AM

## 2012-05-16 NOTE — Progress Notes (Signed)
ANTICOAGULATION CONSULT NOTE - Follow Up Consult  Pharmacy Consult for Warfarin Indication: VTE prophylaxis, recurrent PE's  No Known Allergies  Patient Measurements: Height: 5\' 10"  (177.8 cm) Weight: 174 lb (78.926 kg) IBW/kg (Calculated) : 73   Vital Signs: Temp: 98.1 F (36.7 C) (10/05 0600) BP: 96/50 mmHg (10/05 0600) Pulse Rate: 56  (10/05 0600)  Labs:  Basename 05/16/12 0600 05/15/12 0515 05/14/12 0508  HGB 12.8* 12.6* --  HCT 39.0 38.7* --  PLT 158 150 --  APTT -- -- --  LABPROT 16.0* 19.7* 25.0*  INR 1.31 1.73* 2.39*  HEPARINUNFRC -- -- --  CREATININE -- 1.25 --  CKTOTAL -- -- --  CKMB -- -- --  TROPONINI -- -- --    Estimated Creatinine Clearance: 55.2 ml/min (by C-G formula based on Cr of 1.25).   Medications:  Scheduled:    . aspirin EC  81 mg Oral Daily  . atorvastatin  80 mg Oral q1800  . clopidogrel  75 mg Oral Q breakfast  . enoxaparin (LOVENOX) injection  40 mg Subcutaneous Q24H  . pantoprazole  40 mg Oral Q0600  . sodium chloride  3 mL Intravenous Q12H  . warfarin  6 mg Oral ONCE-1800  . 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 His home dose of warfarin is 4mg  daily and his INR on admit was 2.79. INR was 1.73 after two days of holding warfarin. Warfarin was restarted on 10/4 with a boosted dose (6mg ) of warfarin and INR today was 1.31. Will provide another slightly boosted dose of warfarin today to make up for the two held days. Per Cardiology, a low dose of lovenox was also started. H/H stable. No evidence of bleeding reported.  Goal of Therapy:  INR 2-3 Monitor platelets by anticoagulation protocol: Yes   Plan:  -Warfarin 6mg  PO x 1 at 1800 -Daily PT/INR -Monitor for bleeding, especially at cath site -D/C lovenox when indicated  Abran Duke, PharmD Clinical Pharmacist Phone: [phone] Pager:  [phone] 05/16/2012 11:52 AM

## 2012-05-16 NOTE — Progress Notes (Signed)
Visit to patient for ambulation for Cardiac Rehab Phase 1.  Patient does not want to walk at this time, states he has walked independently today twice and plans to walk later today with his family.  Just anxious to go home.

## 2012-05-17 DIAGNOSIS — Z9861 Coronary angioplasty status: Secondary | ICD-10-CM

## 2012-05-17 LAB — BASIC METABOLIC PANEL
CO2: 26 mEq/L (ref 19–32)
GFR calc non Af Amer: 50 mL/min — ABNORMAL LOW (ref >90)
Glucose, Bld: 103 mg/dL — ABNORMAL HIGH (ref 70–99)
Potassium: 4.2 mEq/L (ref 3.5–5.1)
Sodium: 141 mEq/L (ref 135–145)

## 2012-05-17 LAB — PROTIME-INR
INR: 1.27 (ref 0.00–1.49)
Prothrombin Time: 15.6 seconds — ABNORMAL HIGH (ref 11.6–15.2)

## 2012-05-17 LAB — CBC
Hemoglobin: 13.3 g/dL (ref 13.0–17.0)
RBC: 4.38 MIL/uL (ref 4.22–5.81)

## 2012-05-17 MED ORDER — METOPROLOL SUCCINATE 12.5 MG HALF TABLET
12.5000 mg | ORAL_TABLET | Freq: Every day | ORAL | Status: DC
  Administered 2012-05-17 – 2012-05-19 (×3): 12.5 mg via ORAL
  Filled 2012-05-17 (×3): qty 1

## 2012-05-17 MED ORDER — WARFARIN SODIUM 6 MG PO TABS
6.0000 mg | ORAL_TABLET | Freq: Once | ORAL | Status: AC
  Administered 2012-05-17: 6 mg via ORAL
  Filled 2012-05-17: qty 1

## 2012-05-17 NOTE — Progress Notes (Addendum)
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  Feels fine.  Ambulating without difficulty.  CURRENT MEDS    . aspirin EC  81 mg Oral Daily  . atorvastatin  80 mg Oral q1800  . clopidogrel  75 mg Oral Q breakfast  . enoxaparin (LOVENOX) injection  40 mg Subcutaneous Q24H  . pantoprazole  40 mg Oral Q0600  . sodium chloride  3 mL Intravenous Q12H  . warfarin  6 mg Oral ONCE-1800  . warfarin  6 mg Oral ONCE-1800  . Warfarin - Pharmacist Dosing Inpatient   Does not apply q1800    OBJECTIVE  Filed Vitals:   05/16/12 0600 05/16/12 1400 05/16/12 2130 05/17/12 0600  BP: 96/50 113/68 125/75 100/61  Pulse: 56 52 53 61  Temp: 98.1 F (36.7 C) 98.1 F (36.7 C) 98.8 F (37.1 C) 98.3 F (36.8 C)  TempSrc:      Resp: 15 17 16 15   Height:      Weight:      SpO2: 97% 97% 98% 96%    Intake/Output Summary (Last 24 hours) at 05/17/12 1045 Last data filed at 05/17/12 0819  Gross per 24 hour  Intake    680 ml  Output      0 ml  Net    680 ml   Filed Weights   05/12/12 2211 05/14/12 0500 05/15/12 0500  Weight: 172 lb 9.9 oz (78.3 kg) 173 lb 15.1 oz (78.9 kg) 174 lb (78.926 kg)    PHYSICAL EXAM  General: Pleasant, NAD. Neuro: Alert and oriented X 3. Moves all extremities spontaneously. Psych: Normal affect. HEENT:  Normal except HN deformity due to surgery  Neck: Supple without bruits or JVD. Lungs:  Resp regular and unlabored, CTA. Heart: RRR no s3, s4, or murmurs. Abdomen: Soft, non-tender, non-distended, BS + x 4.  Extremities: No clubbing, cyanosis or edema. DP/PT/Radials 2+ and equal bilaterally.  Accessory Clinical Findings  CBC  Basename 05/17/12 0545 05/16/12 0600  WBC 6.5 6.4  NEUTROABS -- --  HGB 13.3 12.8*  HCT 40.7 39.0  MCV 92.9 92.0  PLT 168 158   Basic  Metabolic Panel  Basename 05/17/12 0545 05/15/12 0515  NA 141 143  K 4.2 3.8  CL 105 108  CO2 26 25  GLUCOSE 103* 99  BUN 21 18  CREATININE 1.37* 1.25  CALCIUM 9.5 9.1  MG -- --  PHOS -- --   Liver Function Tests No results found for this basename: AST:2,ALT:2,ALKPHOS:2,BILITOT:2,PROT:2,ALBUMIN:2 in the last 72 hours No results found for this basename: LIPASE:2,AMYLASE:2 in the last 72 hours Cardiac Enzymes No results found for this basename: CKTOTAL:3,CKMB:3,CKMBINDEX:3,TROPONINI:3 in the last 72 hours BNP No components found with this basename: POCBNP:3 D-Dimer No results found for this basename: DDIMER:2 in the last 72 hours Hemoglobin A1C No results found for this basename: HGBA1C in the last 72 hours Fasting Lipid Panel No results found for this basename: CHOL,HDL,LDLCALC,TRIG,CHOLHDL,LDLDIRECT in the last 72 hours Thyroid Function Tests No results found for this basename: TSH,T4TOTAL,FREET3,T3FREE,THYROIDAB in the last 72 hours  TELE  Rate stable.  No red alerts.    ECG  Not done   Radiology/Studies  No results found.  ASSESSMENT AND PLAN  1.  Inferior MI  ---  Remains stable.  Films reviewed.  Continue medical treatment  2.  HN CA   --- appears stable.  Counseled on smokeless tobacco today again.  More than 4 minutes  3.  History of recurrent PE   -  Now on lovenox and getting loaded with warfarin.  Of note, I would be reluctant to use full dose UFH with recent groin access on full dose warfarin at the time.  Risk of recurrent PE should be low on current regimen as he is ambulatory as well, and INR has not ever completely normalized.  With full dose UFH, think the risk of bleed exceeds to the risk of PE.  Goal for INR short term will be 2.0-2.5.  Hope fully home in am 4.  SP PCI  -  Will check P2Y12 in am--if clopidogrel responsive, dc ASA as per [place].  Will add low dose beta blocker today as HR is ok.  Signed, Shawnie Pons MD, [facility], FSC[facility]

## 2012-05-17 NOTE — Progress Notes (Signed)
ANTICOAGULATION CONSULT NOTE - Follow Up Consult  Pharmacy Consult for Warfarin Indication: VTE prophylaxis, recurrent PE's  No Known Allergies  Patient Measurements: Height: 5\' 10"  (177.8 cm) Weight: 174 lb (78.926 kg) IBW/kg (Calculated) : 73   Vital Signs: Temp: 98.3 F (36.8 C) (10/06 0600) BP: 100/61 mmHg (10/06 0600) Pulse Rate: 61  (10/06 0600)  Labs:  Basename 05/17/12 0545 05/16/12 0600 05/15/12 0515  HGB 13.3 12.8* --  HCT 40.7 39.0 38.7*  PLT 168 158 150  APTT -- -- --  LABPROT 15.6* 16.0* 19.7*  INR 1.27 1.31 1.73*  HEPARINUNFRC -- -- --  CREATININE 1.37* -- 1.25  CKTOTAL -- -- --  CKMB -- -- --  TROPONINI -- -- --    Estimated Creatinine Clearance: 50.3 ml/min (by C-G formula based on Cr of 1.37).   Medications:  Scheduled:     . aspirin EC  81 mg Oral Daily  . atorvastatin  80 mg Oral q1800  . clopidogrel  75 mg Oral Q breakfast  . enoxaparin (LOVENOX) injection  40 mg Subcutaneous Q24H  . pantoprazole  40 mg Oral Q0600  . sodium chloride  3 mL Intravenous Q12H  . warfarin  6 mg Oral ONCE-1800  . 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 His home dose of warfarin is 4mg  daily and his INR on admit was 2.79. INR was 1.73 after two days of holding warfarin. Warfarin was restarted on 10/4 with a boosted dose (6mg ) of warfarin. INR today is 1.27<1.31. Will provide another slightly boosted dose of warfarin today to make up for the two held days. Per Cardiology, a low dose of lovenox was also started. H/H stable. No evidence of bleeding reported.  Goal of Therapy:  INR 2-3 Monitor platelets by anticoagulation protocol: Yes   Plan:  -Warfarin 6mg  PO x 1 at 1800 -Daily PT/INR -Monitor for bleeding, especially at cath site -D/C lovenox when indicated  Abran Duke, PharmD Clinical Pharmacist Phone: [phone] Pager:  [phone] 05/17/2012 9:59 AM

## 2012-05-18 LAB — BASIC METABOLIC PANEL
CO2: 25 mEq/L (ref 19–32)
Calcium: 9.1 mg/dL (ref 8.4–10.5)
Chloride: 105 mEq/L (ref 96–112)
GFR calc Af Amer: 64 mL/min — ABNORMAL LOW (ref >90)
Sodium: 139 mEq/L (ref 135–145)

## 2012-05-18 LAB — PROTIME-INR: INR: 1.47 (ref 0.00–1.49)

## 2012-05-18 LAB — PLATELET INHIBITION P2Y12: Platelet Function  P2Y12: 134 [PRU] — ABNORMAL LOW (ref 194–418)

## 2012-05-18 MED ORDER — WARFARIN SODIUM 6 MG PO TABS
6.0000 mg | ORAL_TABLET | Freq: Once | ORAL | Status: AC
  Administered 2012-05-18: 6 mg via ORAL
  Filled 2012-05-18: qty 1

## 2012-05-18 NOTE — Progress Notes (Signed)
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  He is ambulating without difficulty  CURRENT MEDS    . aspirin EC  81 mg Oral Daily  . atorvastatin  80 mg Oral q1800  . clopidogrel  75 mg Oral Q breakfast  . enoxaparin (LOVENOX) injection  40 mg Subcutaneous Q24H  . metoprolol succinate  12.5 mg Oral Daily  . pantoprazole  40 mg Oral Q0600  . sodium chloride  3 mL Intravenous Q12H  . warfarin  6 mg Oral ONCE-1800  . Warfarin - Pharmacist Dosing Inpatient   Does not apply q1800    OBJECTIVE  Filed Vitals:   05/18/12 0600 05/18/12 1143 05/18/12 1342 05/18/12 2123  BP: 119/71 119/70 128/85 127/67  Pulse: 66 53 69 73  Temp: 97.6 F (36.4 C)  98.2 F (36.8 C) 98.7 F (37.1 C)  TempSrc:   Oral Oral  Resp: 18  18 15   Height:      Weight:      SpO2: 99%  100% 98%    Intake/Output Summary (Last 24 hours) at 05/18/12 2219 Last data filed at 05/18/12 1808  Gross per 24 hour  Intake    900 ml  Output      0 ml  Net    900 ml   Filed Weights   05/12/12 2211 05/14/12 0500 05/15/12 0500  Weight: 172 lb 9.9 oz (78.3 kg) 173 lb 15.1 oz (78.9 kg) 174 lb (78.926 kg)    PHYSICAL EXAM  General: Pleasant, NAD. Neuro: Alert and oriented X 3. Moves all extremities spontaneously. Psych: Normal affect. HEENT:  Normal except facial changes.    Neck: Supple without bruits or JVD. Lungs:  Resp regular and unlabored, CTA. Heart: RRR no s3, s4, or murmurs. Abdomen: Soft, non-tender, non-distended, BS + x 4.  Extremities: No clubbing, cyanosis or edema. DP/PT/Radials 2+ and equal bilaterally.  Accessory Clinical Findings  CBC  Basename 05/17/12 0545 05/16/12 0600  WBC 6.5 6.4  NEUTROABS -- --  HGB 13.3 12.8*  HCT 40.7 39.0  MCV 92.9 92.0  PLT 168 158   Basic  Metabolic Panel  Basename 05/18/12 0605 05/17/12 0545  NA 139 141  K 4.2 4.2  CL 105 105  CO2 25 26  GLUCOSE 103* 103*  BUN 18 21  CREATININE 1.26 1.37*  CALCIUM 9.1 9.5  MG -- --  PHOS -- --   Liver Function Tests No results found for this basename: AST:2,ALT:2,ALKPHOS:2,BILITOT:2,PROT:2,ALBUMIN:2 in the last 72 hours No results found for this basename: LIPASE:2,AMYLASE:2 in the last 72 hours Cardiac Enzymes No results found for this basename: CKTOTAL:3,CKMB:3,CKMBINDEX:3,TROPONINI:3 in the last 72 hours BNP No components found with this basename: POCBNP:3 D-Dimer No results found for this basename: DDIMER:2 in the last 72 hours Hemoglobin A1C No results found for this basename: HGBA1C in the last 72 hours Fasting Lipid Panel No results found for this basename: CHOL,HDL,LDLCALC,TRIG,CHOLHDL,LDLDIRECT in the last 72 hours Thyroid Function Tests No results found for this basename: TSH,T4TOTAL,FREET3,T3FREE,THYROIDAB in the last 72 hours    Radiology/Studies  No results found.  ASSESSMENT AND PLAN 1.  SP MI with PCI of the RCA 2.  History of pulmonary emboli 3.  Chronic anticoagulation  Plan 1.  One more day of warfarin then home.  Sub cu  lovenox today.  Not using full dose.  SP post MI.   PlanP  Signed, Shawnie Pons MD, [facility], [facility]

## 2012-05-18 NOTE — Progress Notes (Signed)
ANTICOAGULATION CONSULT NOTE - Follow Up Consult  Pharmacy Consult for Warfarin Indication: VTE prophylaxis, recurrent PE's  No Known Allergies  Patient Measurements: Height: 5\' 10"  (177.8 cm) Weight: 174 lb (78.926 kg) IBW/kg (Calculated) : 73   Vital Signs: Temp: 97.6 F (36.4 C) (10/07 0600) BP: 119/71 mmHg (10/07 0600) Pulse Rate: 66  (10/07 0600)  Labs:  Basename 05/18/12 0605 05/17/12 0545 05/16/12 0600  HGB -- 13.3 12.8*  HCT -- 40.7 39.0  PLT -- 168 158  APTT -- -- --  LABPROT 17.4* 15.6* 16.0*  INR 1.47 1.27 1.31  HEPARINUNFRC -- -- --  CREATININE 1.26 1.37* --  CKTOTAL -- -- --  CKMB -- -- --  TROPONINI -- -- --    Estimated Creatinine Clearance: 54.7 ml/min (by C-G formula based on Cr of 1.26).    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 His home dose of warfarin is 4mg  daily and his INR on admit was 2.79. INR was 1.73 after two days of holding warfarin. Warfarin was restarted on 10/4.  INR today is 1.47 after 3 doses of 6 mg. LMWH 40 qday on board. H/H stable yesterday. No evidence of bleeding reported.  INR short term goal per Dr. Riley Kill is 2.0-2.5  Goal of Therapy:  INR 2.0 - 2.5 per Dr. Riley Kill for short term Monitor platelets by anticoagulation protocol: Yes   Plan:  -Warfarin 6mg  PO x 1 at 1800 -Daily PT/INR -Monitor for bleeding, especially at cath site - lovenox 40 qday until INR tx Herby Abraham, Pharm.D. [phone] 05/18/2012 9:09 AM

## 2012-05-18 NOTE — Progress Notes (Signed)
[id number] Cardiac Rehab Pt states that he has walked in hall three times this morning. He denies any problems.

## 2012-05-19 ENCOUNTER — Encounter (HOSPITAL_COMMUNITY): Payer: Self-pay | Admitting: Nurse Practitioner

## 2012-05-19 DIAGNOSIS — I2510 Atherosclerotic heart disease of native coronary artery without angina pectoris: Secondary | ICD-10-CM

## 2012-05-19 LAB — PROTIME-INR: Prothrombin Time: 19.3 seconds — ABNORMAL HIGH (ref 11.6–15.2)

## 2012-05-19 MED ORDER — ASPIRIN 81 MG PO TBEC: 81.0000 mg | DELAYED_RELEASE_TABLET | Freq: Every day | ORAL | Status: DC

## 2012-05-19 MED ORDER — NITROGLYCERIN 0.4 MG SL SUBL: 0.4000 mg | SUBLINGUAL_TABLET | SUBLINGUAL | Status: DC | PRN

## 2012-05-19 MED ORDER — ATORVASTATIN CALCIUM 80 MG PO TABS: 80.0000 mg | ORAL_TABLET | Freq: Every day | ORAL | Status: DC

## 2012-05-19 MED ORDER — METOPROLOL SUCCINATE ER 25 MG PO TB24: 12.5000 mg | ORAL_TABLET | Freq: Every day | ORAL | Status: DC

## 2012-05-19 MED ORDER — CLOPIDOGREL BISULFATE 75 MG PO TABS: 75.0000 mg | ORAL_TABLET | Freq: Every day | ORAL | Status: DC

## 2012-05-19 MED ORDER — PANTOPRAZOLE SODIUM 40 MG PO TBEC: 40.0000 mg | DELAYED_RELEASE_TABLET | Freq: Every day | ORAL | Status: DC

## 2012-05-19 NOTE — Progress Notes (Signed)
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  No chest pain.  Ambulating without difficulty.  Discussed issues in great detail with the patient.  He should have follow up of INR on Thursday.  CURRENT MEDS    . aspirin EC  81 mg Oral Daily  . atorvastatin  80 mg Oral q1800  . clopidogrel  75 mg Oral Q breakfast  . enoxaparin (LOVENOX) injection  40 mg Subcutaneous Q24H  . metoprolol succinate  12.5 mg Oral Daily  . pantoprazole  40 mg Oral Q0600  . sodium chloride  3 mL Intravenous Q12H  . warfarin  6 mg Oral ONCE-1800  . Warfarin - Pharmacist Dosing Inpatient   Does not apply q1800    OBJECTIVE  Filed Vitals:   05/18/12 1342 05/18/12 2123 05/19/12 0554 05/19/12 0615  BP: 128/85 127/67 104/66   Pulse: 69 73 59   Temp: 98.2 F (36.8 C) 98.7 F (37.1 C) 97.8 F (36.6 C)   TempSrc: Oral Oral Oral   Resp: 18 15 15    Height:      Weight:    171 lb 8.3 oz (77.8 kg)  SpO2: 100% 98% 96%     Intake/Output Summary (Last 24 hours) at 05/19/12 0831 Last data filed at 05/18/12 1808  Gross per 24 hour  Intake    900 ml  Output      0 ml  Net    900 ml   Filed Weights   05/14/12 0500 05/15/12 0500 05/19/12 0615  Weight: 173 lb 15.1 oz (78.9 kg) 174 lb (78.926 kg) 171 lb 8.3 oz (77.8 kg)    PHYSICAL EXAM  General: Pleasant, NAD. Neuro: Alert and oriented X 3. Moves all extremities spontaneously. Psych: Normal affect. HEENT:  Normal  Neck: Supple without bruits or JVD.  Surgery from left neck Lungs:  Resp regular and unlabored, CTA. Heart: RRR no s3, s4, or murmurs. Abdomen: Soft, non-tender, non-distended, BS + x 4.  Extremities: No clubbing, cyanosis or edema. DP/PT/Radials 2+ and equal bilaterally.  Accessory Clinical Findings  CBC  Basename 05/17/12 0545  WBC 6.5   NEUTROABS --  HGB 13.3  HCT 40.7  MCV 92.9  PLT 168   Basic Metabolic Panel  Basename 05/18/12 0605 05/17/12 0545  NA 139 141  K 4.2 4.2  CL 105 105  CO2 25 26  GLUCOSE 103* 103*  BUN 18 21  CREATININE 1.26 1.37*  CALCIUM 9.1 9.5  MG -- --  PHOS -- --   Liver Function Tests No results found for this basename: AST:2,ALT:2,ALKPHOS:2,BILITOT:2,PROT:2,ALBUMIN:2 in the last 72 hours No results found for this basename: LIPASE:2,AMYLASE:2 in the last 72 hours Cardiac Enzymes No results found for this basename: CKTOTAL:3,CKMB:3,CKMBINDEX:3,TROPONINI:3 in the last 72 hours BNP No components found with this basename: POCBNP:3 D-Dimer No results found for this basename: DDIMER:2 in the last 72 hours Hemoglobin A1C No results found for this basename: HGBA1C in the last 72 hours Fasting Lipid Panel No results found for this basename: CHOL,HDL,LDLCALC,TRIG,CHOLHDL,LDLDIRECT in the last 72 hours Thyroid Function Tests No results found for this basename: TSH,T4TOTAL,FREET3,T3FREE,THYROIDAB in the last 72 hours  INR as noted  Radiology/Studies  No results found.  ASSESSMENT AND PLAN  1.  SP inferior wall MI treated with stenting 2.  History of recurrent  PE on warfarin. 3.  INR near therapeutic.     Plan   1.  Dc on warfarin, plavix. Stop ASA in two days as patient is a responder to clopidogrel. 2.  Continue low dose beta blockers and statin. 3.  Early follow up with Dr. Antoine Poche 4.  Residual disease discussed and reviewed with team--all in agreement that medical therapy is best option given suboptimal nature of OM for PCI and need for long term warfarin. 5.  Needs cancer follow up  -- has not had. 6.  No driving for two weeks.     Discussed with patient in detail.   More than thirty minutes of combined dc time.   Signed, Shawnie Pons MD, [facility], FSC[facility]

## 2012-05-19 NOTE — Discharge Summary (Signed)
Patient ID: Anthony Dodson,  MRN: [id number], DOB/AGE: 72/05/1941 72 y.o.  Admit date: 05/12/2012 Discharge date: 05/19/2012  Primary Care Provider: Lauro Franklin, NP Primary Cardiologist: J. Hochrein, MD - [facility]  Discharge Diagnoses Principal Problem:  *ST elevation myocardial infarction (STEMI) of inferoposterior wall  **S/P PCI/BMS to occluded proximal RCA this admission. Active Problems:  CAD (coronary artery disease)  **Residual moderate-severe LAD/OM1 dzs -> Medically managed  Chronic pulmonary embolism  **On chronic coumadin w/ INR's followed by PCP.  History of head or neck cancer  Smokeless Tobacco Abuse  Allergies No Known Allergies  Procedures  Cardiac Catheterization and Percutaneous Coronary Intervention 05/12/2012  Hemodynamics: AO 127/78 (100) LV 95/6/11 No gradient on pullback.    Coronary angiography: Coronary dominance: right  Left mainstem:  Calcified, very short and without obstruction Left anterior descending (LAD): Courses to the apex.  Diffuse 50% proximal disease that is calcified and segmental.  Then there is an 80% focal lesion.  Distally there is a 70% segmental area of disease.  The distal LAD wraps the apex.    Left circumflex (LCx): provides a large OM1 with 95% ostial narrowing, and large AV circ that supplies a large second OM. The latter is without significant focal narrowing.    Right coronary artery (RCA): totally occluded just beyond the ostium.  Following opening there was a tight lesion proximally that was stented with a non DES stent, 20mm in length, with a 2.75 stent taken to 3.3, with restoration of TIMI3 flow from TIMI 0 flow.  The lesion was reduced to 0%.  The RCA had a moderate PDA, smaller PLA 1 and very large PLA 2 that coursed well out to the apical lateral wall.  The PDA had segmental 40% proximally, and the AV groove continuation branch hasd 30% narrowing.  There was some collateralization prior to reperfusion.     Left  ventriculography: Left ventricular systolic function is mildly reduced with moderate inferior hypokinesis. LVEF is estimated at 50%, and there is no significant mitral regurgitation. _____________  2D Echocardiogram 05/13/2012  Left ventricle: The cavity size was normal. Wall thickness was increased in a pattern of mild LVH. Systolic function was normal. The estimated ejection fraction was in the range of 60% to 65%.     _____________  History of Present Illness  71 y/o male with prior h/o recurrent PE on chronic coumadin anticoagulation who was in his USOH until the evening of admission when he developed severe chest tightness associated with dyspnea and nausea while working out in his yard.  His fiancee called EMS and he was taken to [facility] in [place], where he was found to have inferior ST elevation with reciprocal changes.  Code STEMI was activated.  Initial cardiac markers were normal.  His INR was elevated @ 2.5.  He was transported to [facility] for further evaluation.   Hospital Course  Upon arrival to the [facility] cath lab, pt continued to c/o 6/10 chest pain.  In the setting of elevated INR/chronic coumadin he was assessed for possible radial artery access for his catheterization however he had no significant radial arterial waveform bilaterally and therefor decision was made to obtain access via the R Femoral artery.  Diagnostic catheterization revealed a totally occluded proximal RCA with moderately severe CAD of the LAD and OM1 as well.  The RCA was felt to be the infarct vessel and this was successfully stented using a 2.75 x 20mm Veriflex bare-metal stent.  Pt tolerated procedure well and was  monitored in the coronary intensive care unit post-PCI.  There, he eventually peaked his CK @ 252.6, MB @ 1988, and troponin I @ >20.00.  He was maintained on asa, plavix, statin, and low-dose beta blocker therapy as hr/bp allowed.  His coumadin was initially held, allowing his INR to drift  down in the setting of femoral arterial stick and subsequent sheath removal.  Fortunately, Anthony Dodson suffered no groin site bleeding or significant change in H/H.  Coumadin was resumed on 10/4, along with low dose lovenox.  His INR continued to drift down, reaching a nadir of 1.27 on 10/6, delaying discharge.  Over the past 48 hrs, INR has been rising and we plan to d/c him today in good condition.  In the setting of ongoing coumadin and plavix therapy, we have recommended that he remain on ASA for 2 additional days only, at which point coumadin may be discontinued.    He has had no further chest pain and has been ambulating without symptoms or limitations.  He has been seen by cardiac rehab and also counseled on the importance of chewing tobacco cessation, especially in light of prior h/o head and neck CA.  Discharge Vitals Blood pressure 104/66, pulse 59, temperature 97.8 F (36.6 C), temperature source Oral, resp. rate 15, height 5\' 10"  (1.778 m), weight 171 lb 8.3 oz (77.8 kg), SpO2 96.00%.  Filed Weights   05/14/12 0500 05/15/12 0500 05/19/12 0615  Weight: 173 lb 15.1 oz (78.9 kg) 174 lb (78.926 kg) 171 lb 8.3 oz (77.8 kg)   Labs  CBC  Basename 05/17/12 0545  WBC 6.5  NEUTROABS --  HGB 13.3  HCT 40.7  MCV 92.9  PLT 168   Basic Metabolic Panel  Basename 05/18/12 0605 05/17/12 0545  NA 139 141  K 4.2 4.2  CL 105 105  CO2 25 26  GLUCOSE 103* 103*  BUN 18 21  CREATININE 1.26 1.37*  CALCIUM 9.1 9.5  MG -- --  PHOS -- --   Cardiac Enzymes Lab Results  Component Value Date   CKTOTAL 1945* 05/13/2012   CKMB 224.9* 05/13/2012   TROPONINI >20.00* 05/13/2012   Fasting Lipid Panel Lab Results  Component Value Date   CHOL 141 05/13/2012   HDL 24* 05/13/2012   LDLCALC 60 05/13/2012   TRIG 283* 05/13/2012   CHOLHDL 5.9 05/13/2012   Thyroid Function Tests Lab Results  Component Value Date   TSH 0.969 05/12/2012   Disposition  Pt is being discharged home today in good  condition.  Follow-up Plans & Appointments  Follow-up Information    Follow up with Anthony Rotunda, MD. On 05/27/2012. (1:00 PM)    Contact information:   [facility] [address] [place] [phone]       Follow up with Quentin Mulling. In 2 days. (for follow-up INR)    Contact information:   [address] [place], [place] [address]  [phone] (Office)        Discharge Medications    Medication List     As of 05/19/2012  9:12 AM    STOP taking these medications         omeprazole 40 MG capsule   Commonly known as: PRILOSEC      TAKE these medications         ALPRAZolam 1 MG tablet   Commonly known as: XANAX   Take 1 mg by mouth 2 (two) times daily as needed. For  anxiety      aspirin 81 MG EC tablet x 2 more days only   Take 1 tablet (81 mg total) by mouth daily.      atorvastatin 80 MG tablet   Commonly known as: LIPITOR   Take 1 tablet (80 mg total) by mouth daily at 6 PM.      clopidogrel 75 MG tablet   Commonly known as: PLAVIX   Take 1 tablet (75 mg total) by mouth daily with breakfast.      GLUCOSAMINE PO   Take 1 tablet by mouth daily. Hold while in hospital      metoprolol succinate 25 MG 24 hr tablet   Commonly known as: TOPROL-XL   Take 0.5 tablets (12.5 mg total) by mouth daily.      nitroGLYCERIN 0.4 MG SL tablet   Commonly known as: NITROSTAT   Place 1 tablet (0.4 mg total) under the tongue every 5 (five) minutes x 3 doses as needed for chest pain.      pantoprazole 40 MG tablet   Commonly known as: PROTONIX   Take 1 tablet (40 mg total) by mouth daily at 6 (six) AM.      vitamin B-12 1000 MCG tablet   Commonly known as: CYANOCOBALAMIN   Take 1,000 mcg by mouth daily.      VITAMIN D PO   Take 1 tablet by mouth daily.      warfarin 4 MG tablet   Commonly known as: COUMADIN   Take 4 mg by mouth daily.      Outstanding Labs/Studies  He will f/u with PCP on Jan 08, 2023 10/10 for INR.  Duration of  Discharge Encounter   Greater than 30 minutes including physician time.  Signed, Nicolasa Ducking NP 05/19/2012, 9:12 AM

## 2012-05-21 ENCOUNTER — Encounter: Payer: Self-pay | Admitting: Cardiology

## 2012-05-24 NOTE — Discharge Summary (Signed)
I have seen and personally examined the patient.  He is ready for discharge from the hospital.  I am in agreement with the plans.  He will have follow up with me in the clinic and Dr. Antoine Poche in [place].  TS

## 2012-05-26 ENCOUNTER — Telehealth: Payer: Self-pay | Admitting: Cardiology

## 2012-05-26 NOTE — Telephone Encounter (Signed)
Pt returning nurse vm regarding labs, he can be reached at hm#

## 2012-05-26 NOTE — Telephone Encounter (Signed)
Left message for pt - unsure who called him  -  Pt does have an appointment in the [place] office 10-16 at 1pm.  I reminded him of this and requested he call back if any questions

## 2012-05-27 ENCOUNTER — Encounter: Payer: Self-pay | Admitting: Cardiology

## 2012-05-27 ENCOUNTER — Ambulatory Visit (INDEPENDENT_AMBULATORY_CARE_PROVIDER_SITE_OTHER): Payer: Medicare Other | Admitting: Cardiology

## 2012-05-27 VITALS — BP 107/65 | HR 69 | Ht 67.0 in | Wt 174.0 lb

## 2012-05-27 DIAGNOSIS — Z8589 Personal history of malignant neoplasm of other organs and systems: Secondary | ICD-10-CM

## 2012-05-27 DIAGNOSIS — I2119 ST elevation (STEMI) myocardial infarction involving other coronary artery of inferior wall: Secondary | ICD-10-CM

## 2012-05-27 DIAGNOSIS — I2510 Atherosclerotic heart disease of native coronary artery without angina pectoris: Secondary | ICD-10-CM

## 2012-05-27 MED ORDER — ATORVASTATIN CALCIUM 40 MG PO TABS: 40.0000 mg | ORAL_TABLET | Freq: Every day | ORAL | Status: DC

## 2012-05-27 NOTE — Patient Instructions (Addendum)
Your physician recommends that you schedule a follow-up appointment in: 1 month. Your physician has recommended you make the following change in your medication: Decrease your atorvastatin to 40 mg daily. You may break your 80 mg tablet in 1/2 until they are finished. Your new prescription has been sent to your pharmacy. You have been referred to an Oncologists.

## 2012-05-27 NOTE — Progress Notes (Signed)
HPI The patient presents for followup after recent inferior myocardial infarction and urgent PCI with a non-drug-eluting stent. He has coronary disease as described below. The decision was made to manage the remainder of his blockages medically. He is on warfarin for recurrent pulmonary emboli. He says that since discharge she has had no further chest discomfort. He has had no neck or arm discomfort. He denies any shortness of breath, PND or orthopnea. He has had no weight gain or edema. He has had some joint pain particularly in his right knee. He had some mild discomfort at the right groin vascular access site. He has had some slight bruising. He unfortunately continues to chew tobacco.  No Known Allergies  Current Outpatient Prescriptions  Medication Sig Dispense Refill  . ALPRAZolam (XANAX) 1 MG tablet Take 1 mg by mouth 2 (two) times daily as needed. For anxiety      . atorvastatin (LIPITOR) 80 MG tablet Take 1 tablet (80 mg total) by mouth daily at 6 PM.  30 tablet  6  . Cholecalciferol (VITAMIN D PO) Take 1 tablet by mouth daily.      . clopidogrel (PLAVIX) 75 MG tablet Take 1 tablet (75 mg total) by mouth daily with breakfast.  30 tablet  6  . GLUCOSAMINE PO Take 1 tablet by mouth daily.       . metoprolol succinate (TOPROL-XL) 25 MG 24 hr tablet Take 0.5 tablets (12.5 mg total) by mouth daily.  30 tablet  3  . nitroGLYCERIN (NITROSTAT) 0.4 MG SL tablet Place 1 tablet (0.4 mg total) under the tongue every 5 (five) minutes x 3 doses as needed for chest pain.  25 tablet  3  . pantoprazole (PROTONIX) 40 MG tablet Take 1 tablet (40 mg total) by mouth daily at 6 (six) AM.  30 tablet  6  . vitamin B-12 (CYANOCOBALAMIN) 1000 MCG tablet Take 1,000 mcg by mouth daily.      Marland Kitchen warfarin (COUMADIN) 4 MG tablet Take 4 mg by mouth daily.      Marland Kitchen warfarin (COUMADIN) 5 MG tablet Take 5 mg by mouth as directed.        Past Medical History  Diagnosis Date  . Pulmonary emboli     a. recurrent - last  2012.  Chronic Coumadin  . GERD (gastroesophageal reflux disease)   . Anxiety   . Cancer     a. 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 CAD (coronary artery disease)     a. 05/2012 Acute Inf/Post MI, Cath/PCI: LM nl, LAD 50p, 58m, 70d, LCX nl, OM1 95ost, OM2 nl, RCA 100ost (2.75x20 Veriflex BMS), PDA 40p, EF 50%.  Residual dzs medically managed.;  b.05/2012 Echo: EF 60-65%, mild LVH   . Smokeless tobacco use     a. chew    No past surgical history on file.  ROS:  As stated in the HPI and negative for all other systems.  PHYSICAL EXAM BP 107/65  Pulse 69  Ht 5\' 7"  (1.702 m)  Wt 174 lb (78.926 kg)  BMI 27.25 kg/m2 GENERAL:  Well appearing HEENT:  Pupils equal round and reactive, fundi not visualized, oral mucosa unremarkable, status post jaw surgery NECK:  No jugular venous distention, waveform within normal limits, carotid upstroke brisk and symmetric, no bruits, no thyromegaly LYMPHATICS:  No cervical, inguinal adenopathy LUNGS:  Clear to auscultation bilaterally BACK:  No CVA tenderness CHEST:  Unremarkable HEART:  PMI not displaced or sustained,S1 and S2  within normal limits, no S3, no S4, no clicks, no rubs, no murmurs ABD:  Flat, positive bowel sounds normal in frequency in pitch, no bruits, no rebound, no guarding, no midline pulsatile mass, no hepatomegaly, no splenomegaly EXT:  2 plus pulses throughout, no edema, no cyanosis no clubbing SKIN:  No rashes no nodules NEURO:  Cranial nerves II through XII grossly intact, motor grossly intact throughout PSYCH:  Cognitively intact, oriented to person place and time   ASSESSMENT AND PLAN  CAD:  The patient has coronary disease as described.  He will need to remain on the Plavix for at least one month. I will see him back to discuss discontinuing this. For now we're managing his other disease medically and this was discussed with Dr. Riley Kill prior to discharge. The patient may decide to go to [facility] to evaluate  relation therapy. We discussed the need for risk reduction.  Head and neck cancer: The patient received surgery and radiation in [place]. However, he did not complete the suggested radiation by his report. I have not received any records on this. He wants to have his followup closer to home and we will try to get him an oncology appointment in [place]. I have asked him to get all of his medical records and hand carry them to this appointment. I think it would be important for [place] to followup after this visit to understand the prognosis from his cancer as we consider further therapies.  Lipids:  His LDL was 60. Triglycerides were 283. Given his concern about muscle pain I will reduce his Lipitor to 40 mg and check a lipid profile sometime around the next appointment. The goal will be an LDL less than 100 and HDL greater than 40.  Tobacco abuse: He is still using smokeless tobacco. I discussed this and the need to quit smokeless tobacco.   Pulmonary emboli: The patient apparently has had recurrent pulmonary emboli and has been told that he will be on warfarin for life. It might be a candidate for another agent in the future such Xarelto. However, I will continue warfarin for now.

## 2012-06-15 ENCOUNTER — Telehealth: Payer: Self-pay | Admitting: Cardiology

## 2012-06-15 NOTE — Telephone Encounter (Signed)
Records Received interoffice From [facility],they are for Dr.Hochrein appt 07/16/12, these Were Given to [facility] 06/15/12/KM

## 2012-07-16 ENCOUNTER — Encounter: Payer: Self-pay | Admitting: Cardiology

## 2012-07-16 ENCOUNTER — Ambulatory Visit (INDEPENDENT_AMBULATORY_CARE_PROVIDER_SITE_OTHER): Payer: Medicare Other | Admitting: Cardiology

## 2012-07-16 VITALS — BP 128/78 | HR 66 | Ht 69.0 in | Wt 171.0 lb

## 2012-07-16 DIAGNOSIS — E785 Hyperlipidemia, unspecified: Secondary | ICD-10-CM

## 2012-07-16 MED ORDER — ASPIRIN EC 81 MG PO TBEC: 81.0000 mg | DELAYED_RELEASE_TABLET | Freq: Every day | ORAL | Status: DC

## 2012-07-16 NOTE — Progress Notes (Signed)
HPI The patient presents for followup after recent inferior myocardial infarction and urgent PCI with a non-drug-eluting stent this October. He has coronary disease as described below. The decision was made to manage the remainder of his blockages medically. He is on warfarin for recurrent pulmonary emboli.   This is his second office visit. He has been participating in cardiac rehabilitation. With this he denies any chest pressure, neck or arm discomfort. He has had no shortness of breath, PND or orthopnea. He has no palpitations, presyncope or syncope. He has had no weight gain or edema. Marland Kitchen He unfortunately continues to chew tobacco.  No Known Allergies  Current Outpatient Prescriptions  Medication Sig Dispense Refill  . ALPRAZolam (XANAX) 1 MG tablet Take 1 mg by mouth 2 (two) times daily as needed. For anxiety      . atorvastatin (LIPITOR) 40 MG tablet Take 1 tablet (40 mg total) by mouth daily.  30 tablet  6  . Cholecalciferol (VITAMIN D PO) Take 1 tablet by mouth daily.      . clopidogrel (PLAVIX) 75 MG tablet Take 1 tablet (75 mg total) by mouth daily with breakfast.  30 tablet  6  . GLUCOSAMINE PO Take 1 tablet by mouth daily.       . metoprolol succinate (TOPROL-XL) 25 MG 24 hr tablet Take 0.5 tablets (12.5 mg total) by mouth daily.  30 tablet  3  . nitroGLYCERIN (NITROSTAT) 0.4 MG SL tablet Place 1 tablet (0.4 mg total) under the tongue every 5 (five) minutes x 3 doses as needed for chest pain.  25 tablet  3  . pantoprazole (PROTONIX) 40 MG tablet Take 1 tablet (40 mg total) by mouth daily at 6 (six) AM.  30 tablet  6  . vitamin B-12 (CYANOCOBALAMIN) 1000 MCG tablet Take 1,000 mcg by mouth daily.      Marland Kitchen warfarin (COUMADIN) 4 MG tablet Take 4 mg by mouth daily.      Marland Kitchen warfarin (COUMADIN) 5 MG tablet Take 5 mg by mouth as directed.        Past Medical History  Diagnosis Date  . 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 GERD (gastroesophageal reflux  disease)   . Anxiety   . Cancer     a. 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 CAD (coronary artery disease)     a. 05/2012 Acute Inf/Post MI, Cath/PCI: LM nl, LAD 50p, 43m, 70d, LCX nl, OM1 95ost, OM2 nl, RCA 100ost (2.75x20 Veriflex BMS), PDA 40p, EF 50%.  Residual dzs medically managed.;  b.05/2012 Echo: EF 60-65%, mild LVH   . Smokeless tobacco use     a. chew    Past Surgical History  Procedure Date  . Head and neck surgery   . Cholecystectomy   . Kidney stone surgery   . Inguinal hernia repair     x 2    ROS:  As stated in the HPI and negative for all other systems.  PHYSICAL EXAM BP 128/78  Pulse 66  Ht 5\' 9"  (1.753 m)  Wt 171 lb (77.565 kg)  BMI 25.25 kg/m2  SpO2 98% GENERAL:  Well appearing HEENT:  Pupils equal round and reactive, fundi not visualized, oral mucosa unremarkable, status post jaw surgery NECK:  No jugular venous distention, waveform within normal limits, carotid upstroke brisk and symmetric, no bruits, no thyromegaly LUNGS:  Clear to auscultation bilaterally CHEST:  Unremarkable HEART:  PMI not displaced or sustained,S1 and S2 within normal limits, no S3, no S4, no clicks, no rubs, no murmurs ABD:  Flat, positive bowel sounds normal in frequency in pitch, no bruits, no rebound, no guarding, no midline pulsatile mass, no hepatomegaly, no splenomegaly EXT:  2 plus pulses throughout, no edema, no cyanosis no clubbing SKIN:  No rashes no nodules   ASSESSMENT AND PLAN  CAD:  He had bare-metal stent in October. As he needs to remain on warfarin he will now stop the Plavix.   However, I would like him to restart a low dose of aspirin. He will continue with cardiac rehabilitation. He is released to play golf and to slow dance.   Head and neck cancer: The patient received surgery and radiation in [place]. Today I was able to review some notes from 2012. He said he had recent followup with the radiation oncologist who suggested no  need for further studies. The patient would like to followup with oncology here and we have put in a request for referral.   Lipids:  At the last appointment I reduced his statin and muscle aches have improved. He will have a fasting lipid profile with a goal LDL less than 100 and HDL greater than 40.  Tobacco abuse: He is still using smokeless tobacco. I discussed this and the need to quit smokeless tobacco.   Pulmonary emboli: The patient apparently has had recurrent pulmonary emboli and has been told that he will be on warfarin for life.

## 2012-07-16 NOTE — Patient Instructions (Addendum)
Your physician recommends that you schedule a follow-up appointment in: 4 months. Your physician has recommended you make the following change in your medication: Stop plavix and start aspirin 81 mg daily. Your pharmacy has been notified. All other medications will remain the same. Your physician recommends that you return for a FASTING lipid profile as soon as possible at [facility].

## 2012-08-07 ENCOUNTER — Encounter: Payer: Medicare Other | Admitting: Internal Medicine

## 2012-08-07 DIAGNOSIS — Z7901 Long term (current) use of anticoagulants: Secondary | ICD-10-CM

## 2012-08-07 DIAGNOSIS — I2699 Other pulmonary embolism without acute cor pulmonale: Secondary | ICD-10-CM

## 2012-08-07 DIAGNOSIS — C411 Malignant neoplasm of mandible: Secondary | ICD-10-CM

## 2012-08-11 ENCOUNTER — Telehealth: Payer: Self-pay | Admitting: *Deleted

## 2012-08-11 NOTE — Telephone Encounter (Signed)
Lipids are excellent. Continue current therapy and forward results to TAPPER,DAVID B ----- Message ----- From: Eustace Moore, LPN Sent: 96/11/5407 11:52 AM To: Rollene Rotunda, MD  Patient informed and copy sent to PCP.

## 2012-11-17 ENCOUNTER — Encounter: Payer: Self-pay | Admitting: Cardiology

## 2012-11-17 ENCOUNTER — Ambulatory Visit (INDEPENDENT_AMBULATORY_CARE_PROVIDER_SITE_OTHER): Payer: Medicare Other | Admitting: Cardiology

## 2012-11-17 VITALS — BP 120/75 | HR 62 | Ht 69.0 in | Wt 178.0 lb

## 2012-11-17 DIAGNOSIS — I2119 ST elevation (STEMI) myocardial infarction involving other coronary artery of inferior wall: Secondary | ICD-10-CM

## 2012-11-17 DIAGNOSIS — I2782 Chronic pulmonary embolism: Secondary | ICD-10-CM

## 2012-11-17 DIAGNOSIS — I2510 Atherosclerotic heart disease of native coronary artery without angina pectoris: Secondary | ICD-10-CM

## 2012-11-17 MED ORDER — NITROGLYCERIN 0.4 MG SL SUBL: 0.4000 mg | SUBLINGUAL_TABLET | SUBLINGUAL | Status: DC | PRN

## 2012-11-17 NOTE — Patient Instructions (Addendum)

## 2012-11-17 NOTE — Progress Notes (Signed)
HPI The patient presents for followup after recent inferior myocardial infarction and urgent PCI with a non-drug-eluting stent last October. He has coronary disease as described below. The decision was made to manage the remainder of his blockages medically. He is on warfarin for recurrent pulmonary emboli.   He returns for followup. He is back to dancing although he has not yet started to golf.  He has completed cardiac rehab.  The patient denies any new symptoms such as chest discomfort, neck or arm discomfort. There has been no new shortness of breath, PND or orthopnea. There have been no reported palpitations, presyncope or syncope. He continues to chew tobacco.  He has had problems with kidney stones.   Overall he feels well.   No Known Allergies  Current Outpatient Prescriptions  Medication Sig Dispense Refill  . ALPRAZolam (XANAX) 1 MG tablet Take 1 mg by mouth 2 (two) times daily as needed. For anxiety      . aspirin EC 81 MG tablet Take 1 tablet (81 mg total) by mouth daily.  30 tablet  6  . atorvastatin (LIPITOR) 40 MG tablet Take 1 tablet (40 mg total) by mouth daily.  30 tablet  6  . Cholecalciferol (VITAMIN D PO) Take 1 tablet by mouth daily.      Marland Kitchen GLUCOSAMINE PO Take 1 tablet by mouth daily.       . metoprolol succinate (TOPROL-XL) 25 MG 24 hr tablet Take 0.5 tablets (12.5 mg total) by mouth daily.  30 tablet  3  . nitroGLYCERIN (NITROSTAT) 0.4 MG SL tablet Place 1 tablet (0.4 mg total) under the tongue every 5 (five) minutes x 3 doses as needed for chest pain.  25 tablet  3  . pantoprazole (PROTONIX) 40 MG tablet Take 1 tablet (40 mg total) by mouth daily at 6 (six) AM.  30 tablet  6  . vitamin B-12 (CYANOCOBALAMIN) 1000 MCG tablet Take 1,000 mcg by mouth daily.      Marland Kitchen warfarin (COUMADIN) 4 MG tablet Take 4 mg by mouth daily.      Marland Kitchen warfarin (COUMADIN) 5 MG tablet Take 5 mg by mouth as directed.       No current facility-administered medications for this visit.    Past  Medical History  Diagnosis Date  . 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 GERD (gastroesophageal reflux disease)   . Anxiety   . Cancer     a. 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 CAD (coronary artery disease)     a. 05/2012 Acute Inf/Post MI, Cath/PCI: LM nl, LAD 50p, 57m, 70d, LCX nl, OM1 95ost, OM2 nl, RCA 100ost (2.75x20 Veriflex BMS), PDA 40p, EF 50%.  Residual dzs medically managed.;  b.05/2012 Echo: EF 60-65%, mild LVH   . Smokeless tobacco use     a. chew    Past Surgical History  Procedure Laterality Date  . Head and neck surgery    . Cholecystectomy    . Kidney stone surgery    . Inguinal hernia repair      x 2    ROS:  As stated in the HPI and negative for all other systems.  PHYSICAL EXAM BP 120/75  Pulse 62  Ht 5\' 9"  (1.753 m)  Wt 178 lb (80.74 kg)  BMI 26.27 kg/m2 GENERAL:  Well appearing HEENT:  Pupils equal round and reactive, fundi not visualized, oral mucosa unremarkable, status post jaw  surgery NECK:  No jugular venous distention, waveform within normal limits, carotid upstroke brisk and symmetric, no bruits, no thyromegaly LUNGS:  Clear to auscultation bilaterally HEART:  PMI not displaced or sustained,S1 and S2 within normal limits, no S3, no S4, no clicks, no rubs, no murmurs ABD:  Flat, positive bowel sounds normal in frequency in pitch, no bruits, no rebound, no guarding, no midline pulsatile mass, no hepatomegaly, no splenomegaly EXT:  2 plus pulses throughout, no edema, no cyanosis no clubbing SKIN:  No rashes no nodules   ASSESSMENT AND PLAN  CAD:  He had bare-metal stent in October. At the last visit he stop Plavix. He remains on a low-dose aspirin and warfarin. Is having no bleeding problems. No change in therapy is indicated.  Head and neck cancer: He did see an oncologist here after having most of his care in [place]. They have cleared him and no further therapy or imaging is  indicated.  Lipids:  At the last appointment I reduced his statin and muscle aches have improved. He id an LDL of 54 and HDL of 34 in Dec.  He will remain on the meds as listed.   Tobacco abuse: He is still using smokeless tobacco. I again discussed this and the need to quit smokeless tobacco.   Pulmonary emboli: The patient apparently has had recurrent pulmonary emboli and has been told that he will be on warfarin for life.     Preop: The patient is apparently going to have ESWL as an outpatient.   The providers are requesting cardiology clearance.   He also needs to come off of his warfarin.  Based on ACC/AHA guidelines, the patient would be at acceptable risk for the planned procedure without further cardiovascular testing.  However, he would need bridging Lovenox when he comes off of warfarin.

## 2013-04-08 DIAGNOSIS — R079 Chest pain, unspecified: Secondary | ICD-10-CM

## 2013-04-09 ENCOUNTER — Other Ambulatory Visit: Payer: Self-pay

## 2013-04-09 ENCOUNTER — Inpatient Hospital Stay (HOSPITAL_COMMUNITY)
Admission: AD | Admit: 2013-04-09 | Discharge: 2013-04-16 | DRG: 249 | Disposition: A | Discharge status: Home / Self Care | Payer: Medicare Other | Source: Other Acute Inpatient Hospital | Attending: Internal Medicine | Admitting: Internal Medicine

## 2013-04-09 ENCOUNTER — Encounter (HOSPITAL_COMMUNITY): Payer: Self-pay | Admitting: General Practice

## 2013-04-09 ENCOUNTER — Other Ambulatory Visit: Payer: Self-pay | Admitting: Physician Assistant

## 2013-04-09 DIAGNOSIS — Z86711 Personal history of pulmonary embolism: Secondary | ICD-10-CM

## 2013-04-09 DIAGNOSIS — I252 Old myocardial infarction: Secondary | ICD-10-CM

## 2013-04-09 DIAGNOSIS — E785 Hyperlipidemia, unspecified: Secondary | ICD-10-CM | POA: Diagnosis present

## 2013-04-09 DIAGNOSIS — I200 Unstable angina: Secondary | ICD-10-CM

## 2013-04-09 DIAGNOSIS — Z87891 Personal history of nicotine dependence: Secondary | ICD-10-CM

## 2013-04-09 DIAGNOSIS — Y840 Cardiac catheterization as the cause of abnormal reaction of the patient, or of later complication, without mention of misadventure at the time of the procedure: Secondary | ICD-10-CM | POA: Diagnosis not present

## 2013-04-09 DIAGNOSIS — Y9210 Unspecified residential institution as the place of occurrence of the external cause: Secondary | ICD-10-CM | POA: Diagnosis not present

## 2013-04-09 DIAGNOSIS — Z9861 Coronary angioplasty status: Secondary | ICD-10-CM

## 2013-04-09 DIAGNOSIS — I10 Essential (primary) hypertension: Secondary | ICD-10-CM | POA: Diagnosis present

## 2013-04-09 DIAGNOSIS — I2782 Chronic pulmonary embolism: Secondary | ICD-10-CM | POA: Diagnosis present

## 2013-04-09 DIAGNOSIS — I4510 Unspecified right bundle-branch block: Secondary | ICD-10-CM

## 2013-04-09 DIAGNOSIS — Z7901 Long term (current) use of anticoagulants: Secondary | ICD-10-CM

## 2013-04-09 DIAGNOSIS — R079 Chest pain, unspecified: Secondary | ICD-10-CM

## 2013-04-09 DIAGNOSIS — Z79899 Other long term (current) drug therapy: Secondary | ICD-10-CM

## 2013-04-09 DIAGNOSIS — I2510 Atherosclerotic heart disease of native coronary artery without angina pectoris: Principal | ICD-10-CM | POA: Diagnosis present

## 2013-04-09 DIAGNOSIS — Z7982 Long term (current) use of aspirin: Secondary | ICD-10-CM

## 2013-04-09 DIAGNOSIS — IMO0002 Reserved for concepts with insufficient information to code with codable children: Secondary | ICD-10-CM | POA: Diagnosis not present

## 2013-04-09 DIAGNOSIS — S301XXA Contusion of abdominal wall, initial encounter: Secondary | ICD-10-CM

## 2013-04-09 HISTORY — DX: Essential (primary) hypertension: I10

## 2013-04-09 LAB — PROTIME-INR: Prothrombin Time: 26.1 seconds — ABNORMAL HIGH (ref 11.6–15.2)

## 2013-04-09 MED ORDER — NITROGLYCERIN 2 % TD OINT
0.5000 [in_us] | TOPICAL_OINTMENT | Freq: Four times a day (QID) | TRANSDERMAL | Status: DC
  Administered 2013-04-09 – 2013-04-13 (×14): 0.5 [in_us] via TOPICAL
  Filled 2013-04-09: qty 30

## 2013-04-09 MED ORDER — SODIUM CHLORIDE 0.9 % IV SOLN
INTRAVENOUS | Status: DC
  Administered 2013-04-09: 17:00:00 via INTRAVENOUS

## 2013-04-09 MED ORDER — ASPIRIN EC 81 MG PO TBEC
81.0000 mg | DELAYED_RELEASE_TABLET | Freq: Every day | ORAL | Status: DC
  Administered 2013-04-10 – 2013-04-16 (×6): 81 mg via ORAL
  Filled 2013-04-09 (×7): qty 1

## 2013-04-09 MED ORDER — ONDANSETRON HCL 4 MG/2ML IJ SOLN: 4.0000 mg | Freq: Four times a day (QID) | INTRAMUSCULAR | Status: DC | PRN

## 2013-04-09 MED ORDER — PANTOPRAZOLE SODIUM 40 MG PO TBEC: 40.0000 mg | DELAYED_RELEASE_TABLET | Freq: Every day | ORAL | Status: DC

## 2013-04-09 MED ORDER — ALPRAZOLAM 0.25 MG PO TABS
1.0000 mg | ORAL_TABLET | Freq: Three times a day (TID) | ORAL | Status: DC
  Administered 2013-04-09 – 2013-04-16 (×14): 1 mg via ORAL
  Filled 2013-04-09: qty 4
  Filled 2013-04-09: qty 2
  Filled 2013-04-09 (×2): qty 1
  Filled 2013-04-09 (×2): qty 2
  Filled 2013-04-09 (×4): qty 4
  Filled 2013-04-09 (×5): qty 2

## 2013-04-09 MED ORDER — ATORVASTATIN CALCIUM 80 MG PO TABS
80.0000 mg | ORAL_TABLET | Freq: Every day | ORAL | Status: DC
  Administered 2013-04-09 – 2013-04-15 (×7): 80 mg via ORAL
  Filled 2013-04-09 (×8): qty 1

## 2013-04-09 MED ORDER — NITROGLYCERIN 2 % TD OINT
1.0000 [in_us] | TOPICAL_OINTMENT | Freq: Four times a day (QID) | TRANSDERMAL | Status: DC
  Filled 2013-04-09: qty 30

## 2013-04-09 MED ORDER — ASPIRIN 81 MG PO CHEW: 324.0000 mg | CHEWABLE_TABLET | ORAL | Status: DC

## 2013-04-09 MED ORDER — SODIUM CHLORIDE 0.9 % IJ SOLN: 3.0000 mL | INTRAMUSCULAR | Status: DC | PRN

## 2013-04-09 MED ORDER — SODIUM CHLORIDE 0.9 % IV SOLN: 250.0000 mL | INTRAVENOUS | Status: DC | PRN

## 2013-04-09 MED ORDER — ACETAMINOPHEN 325 MG PO TABS
650.0000 mg | ORAL_TABLET | ORAL | Status: DC | PRN
  Administered 2013-04-14: 22:00:00 650 mg via ORAL
  Filled 2013-04-09: qty 2

## 2013-04-09 MED ORDER — PANTOPRAZOLE SODIUM 40 MG PO TBEC
40.0000 mg | DELAYED_RELEASE_TABLET | Freq: Every day | ORAL | Status: DC
  Administered 2013-04-10 – 2013-04-15 (×6): 40 mg via ORAL
  Filled 2013-04-09 (×4): qty 1

## 2013-04-09 MED ORDER — TAMSULOSIN HCL 0.4 MG PO CAPS
0.4000 mg | ORAL_CAPSULE | Freq: Every day | ORAL | Status: DC
  Administered 2013-04-09 – 2013-04-12 (×4): 0.4 mg via ORAL
  Filled 2013-04-09 (×8): qty 1

## 2013-04-09 MED ORDER — NITROGLYCERIN 0.4 MG SL SUBL: 0.4000 mg | SUBLINGUAL_TABLET | SUBLINGUAL | Status: DC | PRN

## 2013-04-09 MED ORDER — SODIUM CHLORIDE 0.9 % IJ SOLN
3.0000 mL | Freq: Two times a day (BID) | INTRAMUSCULAR | Status: DC
  Administered 2013-04-11 – 2013-04-12 (×4): 3 mL via INTRAVENOUS

## 2013-04-09 NOTE — Progress Notes (Signed)
Patient arrived via [facility] from [facility] in 5/10 chest pain.  Ronie Spies, PA notified  VSS, oxygen applied and EKG obtained, showing sinus bradycardia. Will continue to monitor. Motley, Mitzi Hansen

## 2013-04-09 NOTE — Progress Notes (Addendum)
ANTICOAGULATION CONSULT NOTE - Initial Consult  Pharmacy Consult for Heparin Indication: pulmonary embolus recurrent  No Known Allergies  Patient Measurements: Weight: 173 lb 4.8 oz (78.608 kg)  Vital Signs: Temp: 97.6 F (36.4 C) (08/29 1530) Temp src: Oral (08/29 1530) BP: 102/74 mmHg (08/29 1530) Pulse Rate: 63 (08/29 1530)  Labs: No results found for this basename: HGB, HCT, PLT, APTT, LABPROT, INR, HEPARINUNFRC, CREATININE, CKTOTAL, CKMB, TROPONINI,  in the last 72 hours  The CrCl is unknown because both a height and weight (above a minimum accepted value) are required for this calculation.   Medical History: Past Medical History  Diagnosis Date  . 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 GERD (gastroesophageal reflux disease)   . Anxiety   . Cancer     a. 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 CAD (coronary artery disease)     a. 05/2012 Acute Inf/Post MI, Cath/PCI: LM nl, LAD 50p, 30m, 70d, LCX nl, OM1 95ost, OM2 nl, RCA 100ost (2.75x20 Veriflex BMS), PDA 40p, EF 50%.  Residual dzs medically managed.;  b.05/2012 Echo: EF 60-65%, mild LVH   . Smokeless tobacco use     a. chew    Medications:  Scheduled:  . ALPRAZolam  1 mg Oral TID  . [START ON 04/10/2013] aspirin  324 mg Oral Pre-Cath  . [START ON 04/10/2013] aspirin EC  81 mg Oral Daily  . atorvastatin  80 mg Oral q1800  . nitroGLYCERIN  0.5 inch Topical Q6H  . [START ON 04/10/2013] pantoprazole  40 mg Oral Q1200  . sodium chloride  3 mL Intravenous Q12H  . tamsulosin  0.4 mg Oral QPC supper   Infusions:  . sodium chloride 50 mL/hr at 04/09/13 1636    Assessment: 72 year old male transferred from [facility] hospital for cardiac cath.  He is on chronic anticoagulation with Coumadin for recurrent PE, and Coumadin is being held for cath.  His INR at [facility] was 2.4, and Heparin is to start when INR <2.  Goal of Therapy:  Heparin level 0.3-0.7 units/ml Monitor  platelets by anticoagulation protocol: Yes   Plan:  Daily PT/INR, will start Heparin when INR <2  Estella Husk, Pharm.D., BCPS, AAHIVP Clinical Pharmacist Phone: [phone] or [phone] 04/09/2013, 4:49 PM   =========================  Addendum: INR remains elevated at 2.49.  Will f/u with INR in AM and start IV heparin if it is less than 2.    Jatavian Calica D. Laney Potash, PharmD, BCPS Pager:  [phone] 04/09/2013, 6:16 PM

## 2013-04-09 NOTE — H&P (Signed)
Patient ID: Anthony Dodson MRN: [id number], DOB/AGE: 72   Admit date: 04/09/2013 Date of Consult: @TODAY @  Primary Physician: Louie Boston, MD Primary Cardiologist: Hochrein    Problem List: Past Medical History  Diagnosis Date  . 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 GERD (gastroesophageal reflux disease)   . Anxiety   . Cancer     a. 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 CAD (coronary artery disease)     a. 05/2012 Acute Inf/Post MI, Cath/PCI: LM nl, LAD 50p, 34m, 70d, LCX nl, OM1 95ost, OM2 nl, RCA 100ost (2.75x20 Veriflex BMS), PDA 40p, EF 50%.  Residual dzs medically managed.;  b.05/2012 Echo: EF 60-65%, mild LVH   . Smokeless tobacco use     a. chew    Past Surgical History  Procedure Laterality Date  . Head and neck surgery    . Cholecystectomy    . Kidney stone surgery    . Inguinal hernia repair      x 2     Allergies: No Known Allergies  HPI:  Patient is a 72 yo who presents on transfer from [place].  History of recurrent PE, Head and neck CA, hyperlipidemia  He has a History of IWMI  S/p PCI with nondrug eluiting stent in 10/13. (see cath below) Plan for medical Rx of his CAD  Doing well until yesterday  . Had been active since MI  Able to dance, work in yard  No CP  Last night, near dark, developed some chest pain  Tried to keep working before dark  Got sick feeling   Went to house  Took  NTG  Got light headed  Scared  Didn't fell good  NTG helped some with CP EMS called   Despite treatment pain has not gone away completely  Now with  4/10 pain, up a little bit from earlier today  Again, never pain free since yesterday.  Mild SOB with talking  WIth deep breath gets sharp pain, different from other With MI last year felt like elephant was on chest  Last night had squeezing sensation with SOB With PEs patient said pain was very severe and he was SOB  No syncope  Dizzy after NTG.  Ate at noon    Still chewing tobacco  Patient has INR followed by primary doctor  INR was in low 1s last week  Dose adjusted  Inpatient Medications:   Family History  Problem Relation Age of Onset  . Lung disease Father     died in his 36's of black lung  . Stroke Mother     died in her 70's.     History   Social History  . Marital Status: Divorced    Spouse Name: N/A    Number of Children: N/A  . Years of Education: N/A   Occupational History  . Not on file.   Social History Main Topics  . Smoking status: Never Smoker   . 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 Alcohol Use: No  . Drug Use: No  . Sexual Activity: Yes   Other Topics Concern  . Not on file   Social History Narrative   Lives in [place], [place] with fiance.  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     Review of Systems: All other systems reviewed and are otherwise negative except as noted above.  Physical Exam: BP 108/  P 53  Tele:  SR  General: Well developed, well nourished,  COmplains of 4/10 CP Head: Normocephalic, atraumatic, sclera non-icteric Neck: Negative for carotid bruits. JVP not elevated. Lungs: Clear bilaterally to auscultation without wheezes, rales, or rhonchi. Breathing is unlabored. Heart: RRR with S1 S2. No murmurs, rubs, or gallops appreciated. Abdomen: Soft, non-tender, non-distended with normoactive bowel sounds. No hepatomegaly. No rebound/guarding. No obvious abdominal masses. Msk:  Strength and tone appears normal for age. Extremities: No clubbing, cyanosis or edema.  Distal pedal pulses are 2+ and equal bilaterally. Neuro: Alert and oriented X 3. Moves all extremities spontaneously. Psych:  Responds to questions appropriately with a normal affect.  Labs: No results found for this or any previous visit (from the past 24  hour(s)).  Radiology/Studies: No results found.  EKG:  Sinus bradycardia 53 bpm  RBBB.  T wave inversion III, AVF.  ASSESSMENT AND PLAN:  Patient is a 72 yo with history of CAD, sp BMS in 10/13.  Doing OK until last night  CP  Went to [place]  Per his report he has never been pain free.  EKG without acute changes Also a history of PE  INR was subtherapeutic last wk   Recomm  Labs pending  Begin Hheparin when INR less than 2 Check CK, trop  D dimer was normal at [facility] NTG paste O2  2.  PE  Not clear why has  Will need crosscoverage   3.  Tobacco Use  Counselled.     Signed, Dietrich Pates 04/09/2013, 4:12 PM

## 2013-04-09 NOTE — H&P (Signed)
[facility]                                       [place], [place]  [address]       NAME:  Anthony Dodson, Anthony Dodson               ROOM:          604-54 UNIT NUMBER:  [id number]                        LOCATION:      74F ADM/VISIT DATE:     04/09/13                ADM PHYS:      Autumn Patty MD ACCT: [id number]                               DOB:           [birth year]    CARDIOLOGY CONSULTATION REPORT                 DATE OF CONSULTATION:  04/09/2013    PRIMARY CARDIOLOGIST:   Dr. Rollene Rotunda.    REFERRING PHYSICIAN:  Dr. Gemma Payor, [facility].    REASON FOR CONSULTATION:  Chest pain.    HISTORY OF PRESENT ILLNESS: The patient is a 72 year old male, with known coronary artery disease, status post acute inferoposterior wall myocardial infarction in October of 2013, requiring emergent PCI with bare metal stenting of a 100% occluded proximal RCA lesion.  Residual anatomy notable for moderately severe LAD and OM 1 disease, treated medically; ejection fraction 50%.    The patient presented here at [facility] ED yesterday, via EMS, from his home in [place], [place], with complaint of chest pain reminiscent of his MI presentation last year.  Additionally, he stated that it occurred during the same circumstances.  That is, while he was weeding in his yard at approximately 9:00 p.m. last night, when he developed sudden shortness of breath followed by dull midsternal chest pain, which was fairly intense (8/10), with associated diaphoresis and nausea.  He also referred to it as a "squeezing" sensation and thought that he was having a heart attack.  He took 1 nitroglycerin tablet with some improvement in his symptoms, but was still experiencing chest pain when he presented to the ED with a blood pressure of 136/82, and pulse of 61.  He received 2 additional baby aspirin tablets and was placed on nitro paste. Serial cardiac markers  were drawn and notable, thus far, for normal troponins with a third set pending.  Of note, he is on chronic Coumadin anticoagulation, secondary to a history of chronic pulmonary emboli, and was therapeutic on admission with an INR level of 2.2.  The patient is currently hemodynamically stable and in sinus bradycardia rhythm, but is still experiencing some mild residual chest pain.    ALLERGIES:  No known drug allergies.    HOME MEDICATIONS: 1.   Coated aspirin 81 mg daily. 2.   Coumadin 5 mg daily. 3.   Flomax 0.4 mg at bedtime. 4.   Protonix 40  mg daily. 5.   Toprol XL 12.5 mg daily. 6.   Xanax 1 mg t.i.d. >>>>>>>>>>>>>>>>>>>>>>>>>>>>>>>>  END OF PAGE  <<<<<<<<<<<<<<<<<<<<<<<<<<<<<<<<                                       [facility]                                       [place], [place]  [address]       NAME:  Anthony Dodson, Anthony Dodson               ROOM:          310-01 UNIT NUMBER:  [id number]                        LOCATION:      8F ADM/VISIT DATE:     04/09/13                ADM PHYS:      Autumn Patty MD ACCT: [id number]                               DOB:           Feb 08, 2041    CARDIOLOGY CONSULTATION REPORT             PAST MEDICAL HISTORY: 1.   Coronary artery disease. a.  Status post inferior STEMI/probable RV infarction, treated with emergent bare metal stenting of 100% proximal RCA, 05/2012. b.  Residual moderately severe LAD and OM 1 disease, treated medically. c.  Ejection fraction 50% (60-65%, by echo). d.  Chronic right bundle branch block. 2.   Chronic pulmonary emboli. a.  Chronic Coumadin anticoagulation. 3.   GERD. 4.   Tobacco, smokeless. 5.   Hyperlipidemia. 6.   Cancer. a.  Head and neck, status post left facial/jaw surgery and radiation. 7.   History of renal insufficiency.    SOCIAL HISTORY:  The patient lives in [place], [place] with his wife.  He denies any history of smoking tobacco.  Denies alcohol use.  The patient enjoys dancing with his  wife.    FAMILY HISTORY:  Noncontributory for premature CAD.    REVIEW OF SYSTEMS:  The patient denies any interim development of exertional chest pain since his myocardial infarction.  He also denies any recent development of exertional dyspnea.  The patient denies a history of diabetes. The remaining systems reviewed are negative.    PHYSICAL EXAMINATION:  Vital signs:  Blood pressure currently is 140/65, pulse 50 and regular, respirations 18, temperature afebrile, saturation 98% on 2 liters, weight is 171 pounds.  General:  The patient is a 72 year old, obese, lying supine, in no distress.  HEENT:  Normocephalic and atraumatic.  PERRLA. EOMI.  Neck:  Palpable ballotable carotid pulses without bruits; no JVD at 30 degrees.  Lungs:  Clear to auscultation bilaterally.  Heart:  Regular rate and rhythm.  No significant murmurs, rubs or gallops.  Abdomen:  Soft, protuberant, intact bowel sounds.  Extremities:  Palpable ballotable bilateral femoral pulses without bruits; brisk bilateral dorsalis pedis pulses; no peripheral edema.  Skin: Warm and dry.  Musculoskeletal:  No obvious deformity. Neurological:  Alert and oriented.    LABORATORY AND RADIOLOGICAL  DATA:  Admission chest x-ray: Bilateral atelectasis with no acute changes.    Admission EKG shows normal sinus rhythm at 63 BPM; borderline LAD; chronic RBBB; evidence of prior IMI. >>>>>>>>>>>>>>>>>>>>>>>>>>>>>>>>  END OF PAGE  <<<<<<<<<<<<<<<<<<<<<<<<<<<<<<<<                                       [facility]                                       [place], [place]  [address]       NAME:  Anthony Dodson, Anthony Dodson               ROOM:          310-01 UNIT NUMBER:  [id number]                        LOCATION:      68F ADM/VISIT DATE:     04/09/13                ADM PHYS:      Autumn Patty MD ACCT: [id number]                               DOB:           12/07/2040    CARDIOLOGY CONSULTATION REPORT             Normal troponins (2), third  set pending.  D-dimer is less than 0.28.  INR 2.2 on admission.  BNP 70 on admission.  LFTs are normal.  Lipid profile:  Total cholesterol 183, triglycerides 327, HDL 38 and LDL 80. TSH 3.6.  Sodium 140, potassium 4.0, BUN 27, creatinine 1.6 (GFR 44) and glucose 137.  CBC: Normal.    IMPRESSION: 1.   Unstable angina pectoris. 2.   Multivessel coronary artery disease.  a.  Status post inferior STEMI/emergent bare metal stenting of 100% proximal RCA, 05/2012.  b.  Moderately severe LAD and OM 1 disease, treated medically.   c.  Preserved left ventricular function. 3.  Chronic pulmonary embolism.  a.  Chronic Coumadin anticoagulation, therapeutic on admission. 4.   Renal insufficiency. 5.   Sinus bradycardia. 6.   Right bundle branch block, chronic.    RECOMMENDATIONS:  Transfer patient directly to [facility], so as to proceed with diagnostic coronary angiography and probable percutaneous intervention.  The patient presents with symptoms reminiscent of his prior presentation, and is also experiencing residual discomfort.  He is currently hemodynamically stable, however, but with a sinus bradycardia rhythm. Therefore, we will not be able to treat with beta blocker therapy.  We will also not be able to initiate anticoagulation with heparin, pending review of a stat protime ordered a short while ago.  As noted, the patient presented with therapeutic INR of 2.2 on admission, and Coumadin will be placed on hold in preparation for his catheterization procedure.  The patient should be overlapped with intravenous heparin in the interim, and then resume Coumadin prior to discharge.  We will also initiate hydration with intravenous fluids, particularly given his elevated creatinine of 1.6.  He will need close monitoring of his renal function, post procedure.  The patient will be continued on aspirin and we  will also initiate high dose statin therapy.    >>>>>>>>>>>>>>>>>>>>>>>>>>>>>>>>   END OF PAGE  <<<<<<<<<<<<<<<<<<<<<<<<<<<<<<<<                                       [facility]                                       [place], [place]  [address]       NAME:  Anthony Dodson, Anthony Dodson               ROOM:          310-01 UNIT NUMBER:  [id number]                        LOCATION:      56F ADM/VISIT DATE:     04/09/13                ADM PHYS:      Autumn Patty MD ACCT: [id number]                               DOB:           2041/05/11    CARDIOLOGY CONSULTATION REPORT          The patient was seen and examined in conjunction with Dr. Purvis Sheffield. An extensive review was done of the patient's prior hospital and office records.          DICTATED NOT READ (unless electronically signed) Daryl Eastern                                             ______________________________                                             Prescott Parma, PA-C                                              ______________________________ MD: Nolon Rod, MD D:  04/09/13 1141 T:  04/09/13 1221 P:  SER2 UI: [id number] DT: CDCPA copy for: cc: ===============================  END OF REPORT  ===============================

## 2013-04-10 DIAGNOSIS — I249 Acute ischemic heart disease, unspecified: Secondary | ICD-10-CM | POA: Insufficient documentation

## 2013-04-10 DIAGNOSIS — I200 Unstable angina: Secondary | ICD-10-CM

## 2013-04-10 LAB — BASIC METABOLIC PANEL
BUN: 20 mg/dL (ref 6–23)
Chloride: 108 mEq/L (ref 96–112)
GFR calc Af Amer: 65 mL/min — ABNORMAL LOW (ref >90)
Potassium: 4.2 mEq/L (ref 3.5–5.1)

## 2013-04-10 LAB — PROTIME-INR: INR: 2.43 — ABNORMAL HIGH (ref 0.00–1.49)

## 2013-04-10 MED ORDER — ASPIRIN 81 MG PO CHEW
324.0000 mg | CHEWABLE_TABLET | ORAL | Status: AC
  Administered 2013-04-13: 324 mg via ORAL
  Filled 2013-04-10: qty 4

## 2013-04-10 NOTE — Progress Notes (Signed)
Patient Name: Anthony Dodson      SUBJECTIVE:   The patient is a 72 year old male, with known  coronary artery disease, status post acute inferoposterior wall myocardial  infarction in October of 2013, requiring emergent PCI with bare metal stenting  of a 100% occluded proximal RCA lesion. Residual anatomy notable for  moderately severe LAD and OM 1 disease, treated medically; ejection fraction  50%. Hx of chronic PE on coumadin and bradycardia precluding betablockers Transferred from [facility] hospital   Some sob this am and residual chest discomfort   Past Medical History  Diagnosis Date  . 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 GERD (gastroesophageal reflux disease)   . Anxiety   . Cancer     a. 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 CAD (coronary artery disease)     a. 05/2012 Acute Inf/Post MI, Cath/PCI: LM nl, LAD 50p, 68m, 70d, LCX nl, OM1 95ost, OM2 nl, RCA 100ost (2.75x20 Veriflex BMS), PDA 40p, EF 50%.  Residual dzs medically managed.;  b.05/2012 Echo: EF 60-65%, mild LVH   . Smokeless tobacco use     a. chew  . Myocardial infarction   . Anginal pain   . Hypertension     Scheduled Meds:  Scheduled Meds: . ALPRAZolam  1 mg Oral TID  . [START ON 04/13/2013] aspirin  324 mg Oral Pre-Cath  . aspirin EC  81 mg Oral Daily  . atorvastatin  80 mg Oral q1800  . nitroGLYCERIN  0.5 inch Topical Q6H  . pantoprazole  40 mg Oral Q1200  . sodium chloride  3 mL Intravenous Q12H  . tamsulosin  0.4 mg Oral QPC supper   Continuous Infusions: . sodium chloride 50 mL/hr at 04/09/13 1636    PHYSICAL EXAM Filed Vitals:   04/09/13 1745 04/09/13 2033 04/10/13 0041 04/10/13 0429  BP: 115/63 107/70 101/64 100/62  Pulse:  54 52 60  Temp:  97.8 F (36.6 C)  97.6 F (36.4 C)  TempSrc:  Oral  Oral  Resp:  16  18  Weight:      SpO2:  99%  96%    Well developed and nourished in no acute distress HENT normal Neck supple with  JVP-flat Clear Regular rate and rhythm, no murmurs or gallops Abd-soft with active BS No Clubbing cyanosis edema Skin-warm and dry A & Oriented  Grossly normal sensory and motor function   TELEMETRY: Reviewed telemetry pt in nsr:    Intake/Output Summary (Last 24 hours) at 04/10/13 0804 Last data filed at 04/10/13 0700  Gross per 24 hour  Intake    720 ml  Output      0 ml  Net    720 ml    LABS: Basic Metabolic Panel:  Recent Labs Lab 04/10/13 0600  NA 140  K 4.2  CL 108  CO2 23  GLUCOSE 106*  BUN 20  CREATININE 1.24  CALCIUM 8.5   Cardiac Enzymes:  Recent Labs  04/09/13 1655  TROPONINI <0.30   CBC: No results found for this basename: WBC, NEUTROABS, HGB, HCT, MCV, PLT,  in the last 168 hours PROTIME:  Recent Labs  04/09/13 1630 04/10/13 0600  LABPROT 26.1* 25.6*  INR 2.49* 2.43*      ASSESSMENT AND PLAN:  Active Problems:   Chronic pulmonary embolism   CAD (coronary artery disease)   Acute coronary syndrome  Pain reminiscent of MI but occurring in setting of  low INR the week before so...   Coumadin on hold Heparin when INR<2 Cath next week  Low grade diuresis Will need prehydration  Signed, Sherryl Manges MD  04/10/2013

## 2013-04-10 NOTE — Progress Notes (Signed)
ANTICOAGULATION CONSULT NOTE - Follow Up Consult  Pharmacy Consult for Heparin Indication: recurrent pulmonary embolus  No Known Allergies  Patient Measurements: Weight: 173 lb 4.8 oz (78.608 kg)   Vital Signs: Temp: 97.6 F (36.4 C) (08/30 0429) Temp src: Oral (08/30 0429) BP: 100/62 mmHg (08/30 0429) Pulse Rate: 60 (08/30 0429)  Labs:  Recent Labs  04/09/13 1630 04/09/13 1655 04/10/13 0600  LABPROT 26.1*  --  25.6*  INR 2.49*  --  2.43*  CREATININE  --   --  1.24  TROPONINI  --  <0.30  --     The CrCl is unknown because both a height and weight (above a minimum accepted value) are required for this calculation.   Medications:  Prescriptions prior to admission  Medication Sig Dispense Refill  . ALPRAZolam (XANAX) 1 MG tablet Take 1 mg by mouth 3 (three) times daily as needed for anxiety. For anxiety      . aspirin EC 81 MG tablet Take 1 tablet (81 mg total) by mouth daily.  30 tablet  6  . atorvastatin (LIPITOR) 40 MG tablet Take 1 tablet (40 mg total) by mouth daily.  30 tablet  6  . Cholecalciferol (VITAMIN D PO) Take 1 tablet by mouth daily.      Marland Kitchen GLUCOSAMINE PO Take 1 tablet by mouth daily.       . metoprolol succinate (TOPROL-XL) 25 MG 24 hr tablet Take 0.5 tablets (12.5 mg total) by mouth daily.  30 tablet  3  . nitroGLYCERIN (NITROSTAT) 0.4 MG SL tablet Place 1 tablet (0.4 mg total) under the tongue every 5 (five) minutes x 3 doses as needed for chest pain.  25 tablet  3  . pantoprazole (PROTONIX) 40 MG tablet Take 1 tablet (40 mg total) by mouth daily at 6 (six) AM.  30 tablet  6  . tamsulosin (FLOMAX) 0.4 MG CAPS capsule Take 0.4 mg by mouth daily.      . vitamin B-12 (CYANOCOBALAMIN) 1000 MCG tablet Take 1,000 mcg by mouth daily.      Marland Kitchen warfarin (COUMADIN) 5 MG tablet Take 5 mg by mouth daily.         Assessment: 72 year old male transferred from [facility] hospital for cardiac cath. He is on chronic anticoagulation with Coumadin for recurrent PE, and  Coumadin is being held for cath. His INR at [facility] was 2.4, and INR today is 2.43. Heparin is to be started when INR <2.  Goal of Therapy:  Heparin level 0.3-0.7 units/ml Monitor platelets by anticoagulation protocol: Yes   Plan:  -Start heparin when INR <2 -Monitor daily PT/INR  Anabel Bene, PharmD Clinical Pharmacist Resident Pager: [phone]

## 2013-04-11 ENCOUNTER — Other Ambulatory Visit: Payer: Self-pay

## 2013-04-11 LAB — BASIC METABOLIC PANEL
GFR calc Af Amer: 70 mL/min — ABNORMAL LOW (ref >90)
GFR calc non Af Amer: 61 mL/min — ABNORMAL LOW (ref >90)
Glucose, Bld: 122 mg/dL — ABNORMAL HIGH (ref 70–99)
Potassium: 3.5 mEq/L (ref 3.5–5.1)
Sodium: 139 mEq/L (ref 135–145)

## 2013-04-11 LAB — PROTIME-INR
INR: 2.29 — ABNORMAL HIGH (ref 0.00–1.49)
Prothrombin Time: 24.5 seconds — ABNORMAL HIGH (ref 11.6–15.2)

## 2013-04-11 NOTE — Progress Notes (Signed)
Patient Name: Anthony Dodson      SUBJECTIVE:   The patient is a 72 year old male, with known  coronary artery disease, status post acute inferoposterior wall myocardial  infarction in October of 2013, requiring emergent PCI with bare metal stenting  of a 100% occluded proximal RCA lesion. Residual anatomy notable for  moderately severe LAD and OM 1 disease, treated medically; ejection fraction  50%. 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 GERD (gastroesophageal reflux disease)   . Anxiety   . Cancer     a. 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 CAD (coronary artery disease)     a. 05/2012 Acute Inf/Post MI, Cath/PCI: LM nl, LAD 50p, 63m, 70d, LCX nl, OM1 95ost, OM2 nl, RCA 100ost (2.75x20 Veriflex BMS), PDA 40p, EF 50%.  Residual dzs medically managed.;  b.05/2012 Echo: EF 60-65%, mild LVH   . Smokeless tobacco use     a. chew  . Myocardial infarction   . Anginal pain   . Hypertension     Scheduled Meds:  Scheduled Meds: . ALPRAZolam  1 mg Oral TID  . [START ON 04/13/2013] aspirin  324 mg Oral Pre-Cath  . aspirin EC  81 mg Oral Daily  . atorvastatin  80 mg Oral q1800  . nitroGLYCERIN  0.5 inch Topical Q6H  . pantoprazole  40 mg Oral Q1200  . sodium chloride  3 mL Intravenous Q12H  . tamsulosin  0.4 mg Oral QPC supper   Continuous Infusions: . sodium chloride 50 mL/hr at 04/09/13 1636    PHYSICAL EXAM Filed Vitals:   04/10/13 1700 04/10/13 1830 04/10/13 2100 04/11/13 0500  BP: 128/74 118/64 144/81 115/69  Pulse: 73  74 82  Temp: 97.7 F (36.5 C)  97.9 F (36.6 C) 98.5 F (36.9 C)  TempSrc: Oral     Resp: 16  20 18   Weight:      SpO2: 96%  98% 94%    Well developed and nourished in no acute distress HENT normal Neck  supple with JVP-flat Clear Regular rate and rhythm, no murmurs or gallops Abd-soft with active BS No Clubbing cyanosis edema Skin-warm and dry A & Oriented  Grossly normal sensory and motor function   TELEMETRY: Reviewed telemetry pt in nsr:    Intake/Output Summary (Last 24 hours) at 04/11/13 0904 Last data filed at 04/10/13 1535  Gross per 24 hour  Intake    100 ml  Output      0 ml  Net    100 ml    LABS: Basic Metabolic Panel:  Recent Labs Lab 04/10/13 0600 04/11/13 0518  NA 140 139  K 4.2 3.5  CL 108 108  CO2 23 22  GLUCOSE 106* 122*  BUN 20 17  CREATININE 1.24 1.16  CALCIUM 8.5 8.3*   Cardiac Enzymes:  Recent Labs  04/09/13 1655  TROPONINI <0.30   CBC: No results found for this basename: WBC, NEUTROABS, HGB, HCT, MCV, PLT,  in the last 168 hours PROTIME:  Recent Labs  04/09/13 1630 04/10/13 0600 04/11/13 0518  LABPROT 26.1* 25.6* 24.5*  INR 2.49* 2.43* 2.29*      ASSESSMENT AND PLAN:  Active Problems:   Chronic pulmonary embolism  CAD (coronary artery disease)   Acute coronary syndrome  Pain reminiscent of MI but occurring in setting of low INR the week before so...   Coumadin on hold Heparin when INR<2 Cath next week  Low grade diuresis Will need prehydration  Signed, Sherryl Manges MD  04/11/2013

## 2013-04-11 NOTE — Progress Notes (Signed)
ANTICOAGULATION CONSULT NOTE - Follow Up Consult  Pharmacy Consult for Heparin Indication: recurrent pulmonary embolus  No Known Allergies  Patient Measurements: Weight: 173 lb 4.8 oz (78.608 kg)   Vital Signs: Temp: 98.5 F (36.9 C) (08/31 0500) BP: 115/69 mmHg (08/31 0500) Pulse Rate: 82 (08/31 0500)  Labs:  Recent Labs  04/09/13 1630 04/09/13 1655 04/10/13 0600 04/11/13 0518  LABPROT 26.1*  --  25.6* 24.5*  INR 2.49*  --  2.43* 2.29*  CREATININE  --   --  1.24 1.16  TROPONINI  --  <0.30  --   --     The CrCl is unknown because both a height and weight (above a minimum accepted value) are required for this calculation.   Medications:  Prescriptions prior to admission  Medication Sig Dispense Refill  . ALPRAZolam (XANAX) 1 MG tablet Take 1 mg by mouth 3 (three) times daily as needed for anxiety. For anxiety      . aspirin EC 81 MG tablet Take 1 tablet (81 mg total) by mouth daily.  30 tablet  6  . atorvastatin (LIPITOR) 40 MG tablet Take 1 tablet (40 mg total) by mouth daily.  30 tablet  6  . Cholecalciferol (VITAMIN D PO) Take 1 tablet by mouth daily.      Marland Kitchen GLUCOSAMINE PO Take 1 tablet by mouth daily.       . metoprolol succinate (TOPROL-XL) 25 MG 24 hr tablet Take 0.5 tablets (12.5 mg total) by mouth daily.  30 tablet  3  . nitroGLYCERIN (NITROSTAT) 0.4 MG SL tablet Place 1 tablet (0.4 mg total) under the tongue every 5 (five) minutes x 3 doses as needed for chest pain.  25 tablet  3  . pantoprazole (PROTONIX) 40 MG tablet Take 1 tablet (40 mg total) by mouth daily at 6 (six) AM.  30 tablet  6  . tamsulosin (FLOMAX) 0.4 MG CAPS capsule Take 0.4 mg by mouth daily.      . vitamin B-12 (CYANOCOBALAMIN) 1000 MCG tablet Take 1,000 mcg by mouth daily.      Marland Kitchen warfarin (COUMADIN) 5 MG tablet Take 5 mg by mouth daily.         Assessment: 72 year old male transferred from [facility] hospital for cardiac cath. He is on chronic anticoagulation with Coumadin for recurrent PE,  and Coumadin is being held for cath. His INR at [facility] was 2.4, and INR today is 2.29. Heparin is to be started when INR <2.  Goal of Therapy:  Heparin level 0.3-0.7 units/ml Monitor platelets by anticoagulation protocol: Yes   Plan:  -Start heparin when INR <2 -Monitor daily PT/INR  Anabel Bene, PharmD Clinical Pharmacist Resident Pager: [phone]

## 2013-04-12 LAB — BASIC METABOLIC PANEL
Calcium: 8.5 mg/dL (ref 8.4–10.5)
GFR calc Af Amer: 71 mL/min — ABNORMAL LOW (ref >90)
GFR calc non Af Amer: 61 mL/min — ABNORMAL LOW (ref >90)
Sodium: 141 mEq/L (ref 135–145)

## 2013-04-12 LAB — PROTIME-INR
INR: 1.66 — ABNORMAL HIGH (ref 0.00–1.49)
Prothrombin Time: 19.1 seconds — ABNORMAL HIGH (ref 11.6–15.2)

## 2013-04-12 MED ORDER — HEPARIN (PORCINE) IN NACL 100-0.45 UNIT/ML-% IJ SOLN
1200.0000 [IU]/h | INTRAMUSCULAR | Status: DC
  Administered 2013-04-12 – 2013-04-13 (×2): 1200 [IU]/h via INTRAVENOUS
  Filled 2013-04-12 (×2): qty 250

## 2013-04-12 NOTE — Progress Notes (Signed)
   SUBJECTIVE:  He did not have pain last night.  No distress   PHYSICAL EXAM Filed Vitals:   04/11/13 1803 04/11/13 2100 04/12/13 0500 04/12/13 0700  BP: 130/80 150/86 110/69   Pulse:  74 68   Temp:  98.6 F (37 C) 98.2 F (36.8 C)   TempSrc:      Resp:  18 16   Height:    5\' 9"  (1.753 m)  Weight:      SpO2:  97% 95%    General:  No distress Lungs:  Clear Heart:  RRR Abdomen:  Positive bowel sounds, no rebound no guarding Extremities:  No edema  LABS: Lab Results  Component Value Date   TROPONINI <0.30 04/09/2013   Results for orders placed during the hospital encounter of 04/09/13 (from the past 24 hour(s))  BASIC METABOLIC PANEL     Status: Abnormal   Collection Time    04/12/13  4:45 AM      Result Value Range   Sodium 141  135 - 145 mEq/L   Potassium 3.9  3.5 - 5.1 mEq/L   Chloride 108  96 - 112 mEq/L   CO2 23  19 - 32 mEq/L   Glucose, Bld 99  70 - 99 mg/dL   BUN 13  6 - 23 mg/dL   Creatinine, Ser 0.98  0.50 - 1.35 mg/dL   Calcium 8.5  8.4 - 11.9 mg/dL   GFR calc non Af Amer 61 (*) >90 mL/min   GFR calc Af Amer 71 (*) >90 mL/min  PROTIME-INR     Status: Abnormal   Collection Time    04/12/13  4:45 AM      Result Value Range   Prothrombin Time 19.1 (*) 11.6 - 15.2 seconds   INR 1.66 (*) 0.00 - 1.49    Intake/Output Summary (Last 24 hours) at 04/12/13 0831 Last data filed at 04/12/13 1478  Gross per 24 hour  Intake   1083 ml  Output      0 ml  Net   1083 ml    ASSESSMENT AND PLAN:  CHEST PAIN:  Troponin negative.  Cath Tuesday.  PULMONARY EMBOLI:  Warfarin on hold.  Starting heparin.  INR less than 2. He will need bridging Lovenox when he goes home.   Rollene Rotunda 04/12/2013 8:31 AM

## 2013-04-12 NOTE — Progress Notes (Addendum)
ANTICOAGULATION CONSULT NOTE - Follow Up Consult  Pharmacy Consult for Heparin and Warfarin Indication: ACS and hx of recurrent PE  No Known Allergies  Patient Measurements: Height: 5\' 9"  (175.3 cm) (Simultaneous filing. User may not have seen previous data.) Weight: 173 lb 4.8 oz (78.608 kg) IBW/kg (Calculated) : 70.7 Heparin Dosing Weight: 78.6 kg  Vital Signs: Temp: 98.2 F (36.8 C) (09/01 0500) BP: 110/69 mmHg (09/01 0500) Pulse Rate: 68 (09/01 0500)  Labs:  Recent Labs  04/09/13 1655 04/10/13 0600 04/11/13 0518 04/12/13 0445  LABPROT  --  25.6* 24.5* 19.1*  INR  --  2.43* 2.29* 1.66*  CREATININE  --  1.24 1.16 1.15  TROPONINI <0.30  --   --   --     Estimated Creatinine Clearance: 57.2 ml/min (by C-G formula based on Cr of 1.15).   Medications:  Scheduled:  . ALPRAZolam  1 mg Oral TID  . [START ON 04/13/2013] aspirin  324 mg Oral Pre-Cath  . aspirin EC  81 mg Oral Daily  . atorvastatin  80 mg Oral q1800  . nitroGLYCERIN  0.5 inch Topical Q6H  . pantoprazole  40 mg Oral Q1200  . sodium chloride  3 mL Intravenous Q12H  . tamsulosin  0.4 mg Oral QPC supper   Infusions:    Assessment: 72 yo M transferred from [facility] hospital for cardiac cath. He is on chronic anticoagulation with Coumadin for recurrent PE, and Coumadin is being held for cath. The plan is to start heparin gtt when INR drops below 2. His INR was 2.4 at [facility] on 8/29, and INR trended down appropriately today to 1.66.  Will start heparin gtt with no bolus at ~16 units/hr. Warfarin continues to be held for cath.  Goal of Therapy:  Heparin level 0.3-0.7 units/ml Monitor platelets by anticoagulation protocol: Yes   Plan:  - start Heparin gtt at 1200 units/hr and draw Hep lvl 6 hours later - daily CBC and Hep lvl - monitor for s/s of bleeding - f/u when to restart Coumadin after cath procedure  Harrold Donath E. Achilles Dunk, PharmD Clinical Pharmacist - Resident Pager: [phone] Pharmacy:  [phone] 04/12/2013 8:07 AM   Addendum:  Heparin level is 0.42 at goal, no bleeding reported per chart.  Plan: Continue heparin infusion at 1200 units/hr F/u Am labs.

## 2013-04-12 NOTE — Progress Notes (Signed)
Wife called to inform nurse that the patient's Insurance will cover 15 Lovenox 80 mg at a co-pay of $90

## 2013-04-13 ENCOUNTER — Encounter (HOSPITAL_COMMUNITY)
Admission: AD | Disposition: A | Discharge status: Home / Self Care | Payer: Self-pay | Source: Other Acute Inpatient Hospital | Attending: Internal Medicine

## 2013-04-13 ENCOUNTER — Other Ambulatory Visit: Payer: Self-pay

## 2013-04-13 ENCOUNTER — Ambulatory Visit (HOSPITAL_COMMUNITY): Admission: RE | Admit: 2013-04-13 | Payer: Medicare Other | Source: Ambulatory Visit | Admitting: Cardiovascular Disease

## 2013-04-13 DIAGNOSIS — I2510 Atherosclerotic heart disease of native coronary artery without angina pectoris: Secondary | ICD-10-CM

## 2013-04-13 HISTORY — PX: PERCUTANEOUS CORONARY STENT INTERVENTION (PCI-S): SHX5485

## 2013-04-13 HISTORY — PX: LEFT HEART CATHETERIZATION WITH CORONARY ANGIOGRAM: SHX5451

## 2013-04-13 LAB — CBC WITH DIFFERENTIAL/PLATELET
Basophils Absolute: 0 10*3/uL (ref 0.0–0.1)
Basophils Relative: 0 % (ref 0–1)
Eosinophils Absolute: 0.2 10*3/uL (ref 0.0–0.7)
Eosinophils Relative: 2 % (ref 0–5)
HCT: 37.9 % — ABNORMAL LOW (ref 39.0–52.0)
Hemoglobin: 12.9 g/dL — ABNORMAL LOW (ref 13.0–17.0)
MCH: 31.5 pg (ref 26.0–34.0)
MCHC: 34 g/dL (ref 30.0–36.0)
Monocytes Absolute: 0.6 10*3/uL (ref 0.1–1.0)
Monocytes Relative: 7 % (ref 3–12)
RDW: 14.2 % (ref 11.5–15.5)

## 2013-04-13 SURGERY — LEFT HEART CATHETERIZATION WITH CORONARY ANGIOGRAM
Anesthesia: LOCAL

## 2013-04-13 MED ORDER — NITROGLYCERIN 0.2 MG/ML ON CALL CATH LAB
INTRAVENOUS | Status: AC
  Filled 2013-04-13: qty 1

## 2013-04-13 MED ORDER — WARFARIN SODIUM 7.5 MG PO TABS
7.5000 mg | ORAL_TABLET | Freq: Once | ORAL | Status: AC
  Administered 2013-04-13: 7.5 mg via ORAL
  Filled 2013-04-13: qty 1

## 2013-04-13 MED ORDER — BIVALIRUDIN 250 MG IV SOLR
INTRAVENOUS | Status: AC
  Filled 2013-04-13: qty 250

## 2013-04-13 MED ORDER — HEPARIN (PORCINE) IN NACL 2-0.9 UNIT/ML-% IJ SOLN
INTRAMUSCULAR | Status: AC
  Filled 2013-04-13: qty 1000

## 2013-04-13 MED ORDER — CLOPIDOGREL BISULFATE 300 MG PO TABS
ORAL_TABLET | ORAL | Status: AC
  Filled 2013-04-13: qty 2

## 2013-04-13 MED ORDER — SODIUM CHLORIDE 0.9 % IV SOLN: 1.0000 mL/kg/h | INTRAVENOUS | Status: AC

## 2013-04-13 MED ORDER — CLOPIDOGREL BISULFATE 75 MG PO TABS
75.0000 mg | ORAL_TABLET | Freq: Every day | ORAL | Status: DC
  Administered 2013-04-14 – 2013-04-16 (×3): 75 mg via ORAL
  Filled 2013-04-13 (×3): qty 1

## 2013-04-13 MED ORDER — ADENOSINE 12 MG/4ML IV SOLN
16.0000 mL | Freq: Once | INTRAVENOUS | Status: DC
  Filled 2013-04-13: qty 16

## 2013-04-13 MED ORDER — WARFARIN - PHARMACIST DOSING INPATIENT
Freq: Every day | Status: DC
  Administered 2013-04-13: 19:00:00

## 2013-04-13 MED ORDER — FENTANYL CITRATE 0.05 MG/ML IJ SOLN
INTRAMUSCULAR | Status: AC
  Filled 2013-04-13: qty 2

## 2013-04-13 MED ORDER — SODIUM CHLORIDE 0.9 % IV SOLN
1.7500 mg/kg/h | INTRAVENOUS | Status: DC
  Filled 2013-04-13 (×2): qty 250

## 2013-04-13 MED ORDER — MIDAZOLAM HCL 5 MG/5ML IJ SOLN
INTRAMUSCULAR | Status: AC
  Filled 2013-04-13: qty 5

## 2013-04-13 MED ORDER — LIDOCAINE HCL (PF) 1 % IJ SOLN
INTRAMUSCULAR | Status: AC
  Filled 2013-04-13: qty 30

## 2013-04-13 MED ORDER — HEPARIN (PORCINE) IN NACL 100-0.45 UNIT/ML-% IJ SOLN
1200.0000 [IU]/h | INTRAMUSCULAR | Status: DC
  Administered 2013-04-13 – 2013-04-14 (×2): 1200 [IU]/h via INTRAVENOUS
  Filled 2013-04-13 (×3): qty 250

## 2013-04-13 NOTE — Progress Notes (Signed)
Utilization Review Completed Tereasa Yilmaz J. Vaudie Engebretsen, RN, BSN, NCM [phone]  

## 2013-04-13 NOTE — Progress Notes (Signed)
Patient post cath IV heparin started 1518 right groin level 0 frequent checks tolerating well.

## 2013-04-13 NOTE — Progress Notes (Signed)
   SUBJECTIVE:  No chest pain again last night.  No SOB   PHYSICAL EXAM Filed Vitals:   04/12/13 0700 04/12/13 1348 04/12/13 2024 04/13/13 0522  BP:  111/64 142/80 120/69  Pulse:  66 70 71  Temp:  98.3 F (36.8 C) 97.9 F (36.6 C) 98.5 F (36.9 C)  TempSrc:  Oral Oral Oral  Resp:  18 18 18  Height: 5' 9" (1.753 m)     Weight:      SpO2:  97% 100% 95%   General:  No distress Lungs:  Clear Heart:  RRR Abdomen:  Positive bowel sounds, no rebound no guarding Extremities:  No edema  LABS:  Results for orders placed during the hospital encounter of 04/09/13 (from the past 24 hour(s))  HEPARIN LEVEL (UNFRACTIONATED)     Status: None   Collection Time    04/12/13  2:59 PM      Result Value Range   Heparin Unfractionated 0.42  0.30 - 0.70 IU/mL    Intake/Output Summary (Last 24 hours) at 04/13/13 0700 Last data filed at 04/12/13 1834  Gross per 24 hour  Intake    600 ml  Output      0 ml  Net    600 ml    ASSESSMENT AND PLAN:  CHEST PAIN:  Troponin negative.  Cath today.  PULMONARY EMBOLI:  Warfarin on hold.  Starting heparin.  INR less than 2. He will need bridging given is recurrent pulmonary emboli.  He wants to stay in the hospital for heparin rather than give himself Lovenox injections. .   Anthony Dodson 04/13/2013 7:00 AM   

## 2013-04-13 NOTE — CV Procedure (Signed)
Cardiac Catheterization Procedure Note  Name: Anthony Dodson MRN: [id number] DOB: 10-09-1940  Procedure: Left Heart Cath, Selective Coronary Angiography, LV angiography, FFR of the LAD, PTCA/Stent of Mid LAD and OM1.   Indication: 72 year old white male with history of coronary disease status post inferior myocardial infarction in 2013. He had stenting of the proximal right coronary at that time. He now presents with progressive, unstable angina class IV. He has a history of hypertension and tobacco use.   Diagnostic Procedure Details: The right groin was prepped, draped, and anesthetized with 1% lidocaine. Using the modified Seldinger technique, a 5 French sheath was introduced into the right femoral artery. Standard Judkins catheters were used for selective coronary angiography and left ventriculography. Catheter exchanges were performed over a wire.  The diagnostic procedure was well-tolerated without immediate complications.  PROCEDURAL FINDINGS Hemodynamics: AO 122/68 with a mean of 90 mm mercury LV 128/15 mmHg  Coronary angiography: Coronary dominance: right  Left mainstem: The left main coronary is short and moderately calcified. It is without significant disease.  Left anterior descending (LAD): The left anterior descending artery is a large vessel extending to the apex. It is moderately calcified in the proximal vessel. In the proximal vessel there is diffuse 30% narrowing. In the mid vessel there is an 80% focal stenosis. In the mid to distal vessel there is a segmental 50-70% stenosis.  Left circumflex (LCx): The left circumflex gives rise to 2 marginal branches. The first obtuse marginal vessel has a 90% stenosis proximally. The remainder of the left circumflex is without significant disease.  Right coronary artery (RCA): The right coronary is a dominant vessel. At the site of the previous stents in the proximal vessel is widely patent with less than 20% irregularities. The  remainder of the vessels without obstructive disease.  Left ventriculography: Left ventricular systolic function is normal, LVEF is estimated at 55-60%, there is mild basilar inferior hypokinesis, there is no significant mitral regurgitation   PCI Procedure Note:  Following the diagnostic procedure, the decision was made to proceed with PCI.The sheath was upsized to a 6 [place]. Weight-based bivalirudin was given for anticoagulation. Plavix 600 mg was given orally. Once a therapeutic ACT was achieved, a 6 [place] XB LAD 3.5 guide catheter was inserted.  A pro-water coronary guidewire was used to cross the lesion in the LAD.  It was unclear whether the LAD lesion was hemodynamically significant. We initially performed FloWire analysis of this vessel. FFR was 0.78 without adenosine. The lesion was predilated with a 2.5 mm balloon.  The lesion was then stented with a 3.0 x 16 mm Rebel stent.  The stent was postdilated with a 3.25 mm noncompliant balloon.  Following PCI, there was 0% residual stenosis and TIMI-3 flow. Final angiography confirmed an excellent result.  We next directed our attention to the first obtuse marginal lesion. This was crossed with a pro-water guidewire. We predilated the lesion with a 2.0 mm balloon. The lesion was then stented with a 2.5 x 12 mm Rebel stent. Following PCI, there was 0% residual stenosis and TIMI 3 flow. Final angiography confirmed an excellent result. Femoral hemostasis was achieved with an Angio-Seal device.  The patient tolerated the PCI procedure well. There were no immediate procedural complications.  The patient was transferred to the post catheterization recovery area for further monitoring.  PCI Data: Vessel #1 - LAD/Segment - mid Percent Stenosis (pre)  80% TIMI-flow 3 Stent 3.0 x 16 mm Rebel Percent Stenosis (post) 0% TIMI-flow (post) 3  Vessel #2-OM 1/proximal Percent stenosis-pre-90% TIMI flow 3 Stent 2.5 x 12 mm Rebel Percent stenoses-post-0% TIMI  flow-post-3  Final Conclusions:   1. 2 vessel obstructive coronary disease. Continued patency of the stent in the RCA. 2. Normal LV function. 3. Abnormal FFR of the mid LAD 4. Successful stenting of the mid LAD and the first obtuse marginal vessel with bare-metal stents.  Recommendations: Aspirin and Plavix initially. We'll resume Coumadin. We'll be able to stop Plavix at 30 days.  [name] [facility] 04/13/2013, 9:25 AM

## 2013-04-13 NOTE — Interval H&P Note (Signed)
History and Physical Interval Note:  04/13/2013 7:48 AM  Anthony Dodson  has presented today for surgery, with the diagnosis of Chest pain  The various methods of treatment have been discussed with the patient and family. After consideration of risks, benefits and other options for treatment, the patient has consented to  Procedure(s): LEFT HEART CATHETERIZATION WITH CORONARY ANGIOGRAM (N/A) as a surgical intervention .  The patient's history has been reviewed, patient examined, no change in status, stable for surgery.  I have reviewed the patient's chart and labs.  Questions were answered to the patient's satisfaction.    Cath Lab Visit (complete for each Cath Lab visit)  Clinical Evaluation Leading to the Procedure:   ACS: yes  Non-ACS:    Anginal Classification: CCS IV  Anti-ischemic medical therapy: Minimal Therapy (1 class of medications)  Non-Invasive Test Results: No non-invasive testing performed  Prior CABG: No previous CABG       Anthony Dodson [facility] 04/13/2013 7:49 AM

## 2013-04-13 NOTE — H&P (View-Only) (Signed)
   SUBJECTIVE:  No chest pain again last night.  No SOB   PHYSICAL EXAM Filed Vitals:   04/12/13 0700 04/12/13 1348 04/12/13 2024 04/13/13 0522  BP:  111/64 142/80 120/69  Pulse:  66 70 71  Temp:  98.3 F (36.8 C) 97.9 F (36.6 C) 98.5 F (36.9 C)  TempSrc:  Oral Oral Oral  Resp:  18 18 18   Height: 5\' 9"  (1.753 m)     Weight:      SpO2:  97% 100% 95%   General:  No distress Lungs:  Clear Heart:  RRR Abdomen:  Positive bowel sounds, no rebound no guarding Extremities:  No edema  LABS:  Results for orders placed during the hospital encounter of 04/09/13 (from the past 24 hour(s))  HEPARIN LEVEL (UNFRACTIONATED)     Status: None   Collection Time    04/12/13  2:59 PM      Result Value Range   Heparin Unfractionated 0.42  0.30 - 0.70 IU/mL    Intake/Output Summary (Last 24 hours) at 04/13/13 0700 Last data filed at 04/12/13 1834  Gross per 24 hour  Intake    600 ml  Output      0 ml  Net    600 ml    ASSESSMENT AND PLAN:  CHEST PAIN:  Troponin negative.  Cath today.  PULMONARY EMBOLI:  Warfarin on hold.  Starting heparin.  INR less than 2. He will need bridging given is recurrent pulmonary emboli.  He wants to stay in the hospital for heparin rather than give himself Lovenox injections. Fayrene Fearing Dion Sibal 04/13/2013 7:00 AM

## 2013-04-13 NOTE — Progress Notes (Signed)
ANTICOAGULATION CONSULT NOTE - Initial Consult  Pharmacy Consult for Coumadin Indication: Hx recurrent PE  No Known Allergies  Patient Measurements: Height: 5\' 9"  (175.3 cm) (Simultaneous filing. User may not have seen previous data.) Weight: 173 lb 4.8 oz (78.608 kg) IBW/kg (Calculated) : 70.7 Heparin Dosing Weight:   Vital Signs: Temp: 97.5 F (36.4 C) (09/02 1221) Temp src: Axillary (09/02 1221) BP: 146/89 mmHg (09/02 1221) Pulse Rate: 60 (09/02 1221)  Labs:  Recent Labs  04/11/13 0518 04/12/13 0445 04/12/13 1459  LABPROT 24.5* 19.1*  --   INR 2.29* 1.66*  --   HEPARINUNFRC  --   --  0.42  CREATININE 1.16 1.15  --     Estimated Creatinine Clearance: 57.2 ml/min (by C-G formula based on Cr of 1.15).   Medical History: Past Medical History  Diagnosis Date  . 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 GERD (gastroesophageal reflux disease)   . Anxiety   . Cancer     a. 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 CAD (coronary artery disease)     a. 05/2012 Acute Inf/Post MI, Cath/PCI: LM nl, LAD 50p, 73m, 70d, LCX nl, OM1 95ost, OM2 nl, RCA 100ost (2.75x20 Veriflex BMS), PDA 40p, EF 50%.  Residual dzs medically managed.;  b.05/2012 Echo: EF 60-65%, mild LVH   . Smokeless tobacco use     a. chew  . Myocardial infarction   . Anginal pain   . Hypertension     Medications:  Scheduled:  . ALPRAZolam  1 mg Oral TID  . aspirin EC  81 mg Oral Daily  . atorvastatin  80 mg Oral q1800  . [START ON 04/14/2013] clopidogrel  75 mg Oral Q breakfast  . pantoprazole  40 mg Oral Q1200  . tamsulosin  0.4 mg Oral QPC supper    Assessment: 72yo male with history of recurrent PE, now s/p cath to resume Coumadin.  INR 1.66 9/1, no INR today.  Heparin pre-cath therapeutic, initiated when INR < 2 & not resumed post-cath.  Home dose of Coumadin was 5mg  daily with therapeutic INR on admission.  Given history of recurrent PE, paged PA to  see if Heparin bridge desired.  He received Angiomax during cath, now concluded & only NS running.  To resume Heparin 6hr post-sheath pull (~1515)  Goal of Therapy:  INR 2-3 Monitor platelets by anticoagulation protocol: Yes   Plan:  1.  Coumadin 7.5mg  today 2.  Heparin 1200 units/hr 3.  Heparin level 8 hr 4.  Daily CBC, HL, INR  Marisue Humble, PharmD Clinical Pharmacist [facility]

## 2013-04-14 ENCOUNTER — Encounter (HOSPITAL_COMMUNITY): Payer: Self-pay | Admitting: Nurse Practitioner

## 2013-04-14 DIAGNOSIS — E785 Hyperlipidemia, unspecified: Secondary | ICD-10-CM

## 2013-04-14 DIAGNOSIS — I10 Essential (primary) hypertension: Secondary | ICD-10-CM | POA: Diagnosis present

## 2013-04-14 DIAGNOSIS — I200 Unstable angina: Secondary | ICD-10-CM

## 2013-04-14 LAB — BASIC METABOLIC PANEL
BUN: 10 mg/dL (ref 6–23)
Calcium: 8.9 mg/dL (ref 8.4–10.5)
Chloride: 108 mEq/L (ref 96–112)
Creatinine, Ser: 1.16 mg/dL (ref 0.50–1.35)
GFR calc Af Amer: 70 mL/min — ABNORMAL LOW (ref >90)
GFR calc non Af Amer: 61 mL/min — ABNORMAL LOW (ref >90)

## 2013-04-14 LAB — CBC
HCT: 38.6 % — ABNORMAL LOW (ref 39.0–52.0)
MCH: 31 pg (ref 26.0–34.0)
MCHC: 33.9 g/dL (ref 30.0–36.0)
MCV: 91.5 fL (ref 78.0–100.0)
RDW: 14.2 % (ref 11.5–15.5)

## 2013-04-14 MED ORDER — WARFARIN SODIUM 7.5 MG PO TABS
7.5000 mg | ORAL_TABLET | Freq: Once | ORAL | Status: AC
  Administered 2013-04-14: 19:00:00 7.5 mg via ORAL
  Filled 2013-04-14: qty 1

## 2013-04-14 MED FILL — Sodium Chloride IV Soln 0.9%: INTRAVENOUS | Qty: 50 | Status: AC

## 2013-04-14 NOTE — Progress Notes (Signed)
CARDIAC REHAB PHASE I   PRE:  Rate/Rhythm: 70 SR    BP: sitting 132/71    SaO2:   MODE:  Ambulation: 420 ft   POST:  Rate/Rhythm: 84 SR    BP: sitting 111/72     SaO2:   Tolerated well. Slow gait. C/o groin soreness. BP lower after walk but denied dizziness. Pt sts he is in process of scheduling prostate surg. Has appt 9/10. Began ed but would be helpful if wife present. Pt chews tobacco daily even though he sts it is less than he used to chew. Discussed risks of tobacco. Pt thinking about cessation. Will f/u. [phone]   Harriet Masson CES, ACSM 04/14/2013 8:40 AM

## 2013-04-14 NOTE — Progress Notes (Signed)
   Patient Name: Anthony Dodson Date of Encounter: 04/14/2013   Principal Problem:   Unstable angina Active Problems:   CAD (coronary artery disease)   Chronic pulmonary embolism   Hyperlipidemia   Hypertension   SUBJECTIVE  No chest pain or sob overnight.  CURRENT MEDS . ALPRAZolam  1 mg Oral TID  . aspirin EC  81 mg Oral Daily  . atorvastatin  80 mg Oral q1800  . clopidogrel  75 mg Oral Q breakfast  . pantoprazole  40 mg Oral Q1200  . tamsulosin  0.4 mg Oral QPC supper  . Warfarin - Pharmacist Dosing Inpatient   Does not apply q1800    OBJECTIVE  Filed Vitals:   04/13/13 2017 04/14/13 0000 04/14/13 0430 04/14/13 0451  BP: 150/87 112/82 127/70 127/70  Pulse: 89 85 77   Temp: 98.6 F (37 C) 97.1 F (36.2 C) 97.8 F (36.6 C)   TempSrc: Oral Oral Oral   Resp: 16 18 14    Height:      Weight:  174 lb 6.1 oz (79.1 kg)    SpO2: 98% 96% 93%     Intake/Output Summary (Last 24 hours) at 04/14/13 0643 Last data filed at 04/14/13 0630  Gross per 24 hour  Intake  872.9 ml  Output   2315 ml  Net -1442.1 ml   Filed Weights   04/09/13 1613 04/14/13 0000  Weight: 173 lb 4.8 oz (78.608 kg) 174 lb 6.1 oz (79.1 kg)    PHYSICAL EXAM  General: Pleasant, NAD. Neuro: Alert and oriented X 3. Moves all extremities spontaneously. Psych: Normal affect. HEENT:  Normal  Neck: Supple without bruits or JVD. Lungs:  Resp regular and unlabored, CTA. Heart: RRR no s3, s4, or murmurs. Abdomen: Soft, non-tender, non-distended, BS + x 4.  Extremities: No clubbing, cyanosis or edema. DP/PT/Radials 1+ and equal bilaterally.  R groin cath site w/o bleeding/bruit/hematoma.  Accessory Clinical Findings  CBC  Recent Labs  04/13/13 1435 04/14/13 0600  WBC 8.6 7.5  NEUTROABS 6.8  --   HGB 12.9* 13.1  HCT 37.9* 38.6*  MCV 92.4 91.5  PLT 163 145*   Basic Metabolic Panel  Recent Labs  04/12/13 0445  NA 141  K 3.9  CL 108  CO2 23  GLUCOSE 99  BUN 13  CREATININE 1.15  CALCIUM  8.5   Lab Results  Component Value Date   INR 1.26 04/14/2013   INR 1.38 04/13/2013   INR 1.66* 04/12/2013   Lab Results  Component Value Date   CHOL 141 05/13/2012   HDL 24* 05/13/2012   LDLCALC 60 05/13/2012   TRIG 283* 05/13/2012   CHOLHDL 5.9 05/13/2012   TELE  rsr  ECG  pending  Radiology/Studies  No results found.  ASSESSMENT AND PLAN  1.  USA/CAD:  S/p pci/bms to the LAD and OM1 yesterday.  No chest pain overnight.  Cont asa, plavix (30d), statin.  Heparin/coumadin resumed post cath.  Will stay in hospital until INR Rx 2/2 h/o PE's.  2.  H/O PE:  As above.  Coumadin resumed with heparin bridging.  He does not think that he can give himself lovenox inj.  INR 1.26 this AM.  3.  HTN:  Stable.    4.  HL:  LDL 60 in 05/2012.    Signed, Nicolasa Ducking NP

## 2013-04-14 NOTE — Progress Notes (Signed)
ANTICOAGULATION CONSULT NOTE - Follow-up Consult  Pharmacy Consult for Heparin Indication: Hx recurrent PE (bridge to coumadin)  No Known Allergies  Patient Measurements: Height: 5\' 9"  (175.3 cm) (Simultaneous filing. User may not have seen previous data.) Weight: 174 lb 6.1 oz (79.1 kg) IBW/kg (Calculated) : 70.7  Vital Signs: Temp: 97.7 F (36.5 C) (09/03 0814) Temp src: Oral (09/03 0814) BP: 132/71 mmHg (09/03 0814) Pulse Rate: 80 (09/03 0814)  Labs:  Recent Labs  04/12/13 0445 04/12/13 1459 04/13/13 1425 04/13/13 1435 04/13/13 2255 04/14/13 0600  HGB  --   --   --  12.9*  --  13.1  HCT  --   --   --  37.9*  --  38.6*  PLT  --   --   --  163  --  145*  LABPROT 19.1*  --  16.6*  --   --  15.5*  INR 1.66*  --  1.38  --   --  1.26  HEPARINUNFRC  --  0.42  --   --  0.58 0.55  CREATININE 1.15  --   --   --   --  1.16    Estimated Creatinine Clearance: 56.7 ml/min (by C-G formula based on Cr of 1.16).  Assessment: 72yo male with history of recurrent PE (on coumadin PTA). Coumadin held for cath 04/13/13 - then resumed post-cath 04/13/13. Continuing heparin bridge until INR therapeutic. Heparin level this AM = 0.55, therapeutic level on 1200 units/hr.  INR = 1.26. Coumadin resumed yesterday evening.  H/H stable, PLTC 145K down from 163K.  No bleeding noted.   Cardiologist notes today that patient does not think he can give himself lovenox injections.   PTA/home dose of coumadin was 5mg  daily.   Goal of Therapy:  INR 2-3; Heparin level 0.3-0.7 Monitor platelets by anticoagulation protocol: Yes   Plan:  1.  Continue heparin 1200 units/hr 2.  Coumadin 7.5 mg po today x1  3.  Daily heparin level, CBC and INR  Noah Delaine, RPh Clinical Pharmacist Pager: [phone] 04/14/2013   8:49 AM

## 2013-04-14 NOTE — Progress Notes (Signed)
SUBJECTIVE:  No chest pain again last night.  No SOB   PHYSICAL EXAM Filed Vitals:   04/13/13 2017 04/14/13 0000 04/14/13 0430 04/14/13 0451  BP: 150/87 112/82 127/70 127/70  Pulse: 89 85 77   Temp: 98.6 F (37 C) 97.1 F (36.2 C) 97.8 F (36.6 C)   TempSrc: Oral Oral Oral   Resp: 16 18 14    Height:      Weight:  174 lb 6.1 oz (79.1 kg)    SpO2: 98% 96% 93%    General:  No distress Lungs:  Clear Heart:  RRR Abdomen:  Positive bowel sounds, no rebound no guarding Extremities:  No edema, right groin without hematoma or echymosis.   LABS:  Results for orders placed during the hospital encounter of 04/09/13 (from the past 24 hour(s))  POCT ACTIVATED CLOTTING TIME     Status: None   Collection Time    04/13/13  8:27 AM      Result Value Range   Activated Clotting Time 519    PROTIME-INR     Status: Abnormal   Collection Time    04/13/13  2:25 PM      Result Value Range   Prothrombin Time 16.6 (*) 11.6 - 15.2 seconds   INR 1.38  0.00 - 1.49  CBC WITH DIFFERENTIAL     Status: Abnormal   Collection Time    04/13/13  2:35 PM      Result Value Range   WBC 8.6  4.0 - 10.5 K/uL   RBC 4.10 (*) 4.22 - 5.81 MIL/uL   Hemoglobin 12.9 (*) 13.0 - 17.0 g/dL   HCT 16.1 (*) 09.6 - 04.5 %   MCV 92.4  78.0 - 100.0 fL   MCH 31.5  26.0 - 34.0 pg   MCHC 34.0  30.0 - 36.0 g/dL   RDW 40.9  81.1 - 91.4 %   Platelets 163  150 - 400 K/uL   Neutrophils Relative % 79 (*) 43 - 77 %   Neutro Abs 6.8  1.7 - 7.7 K/uL   Lymphocytes Relative 11 (*) 12 - 46 %   Lymphs Abs 1.0  0.7 - 4.0 K/uL   Monocytes Relative 7  3 - 12 %   Monocytes Absolute 0.6  0.1 - 1.0 K/uL   Eosinophils Relative 2  0 - 5 %   Eosinophils Absolute 0.2  0.0 - 0.7 K/uL   Basophils Relative 0  0 - 1 %   Basophils Absolute 0.0  0.0 - 0.1 K/uL  HEPARIN LEVEL (UNFRACTIONATED)     Status: None   Collection Time    04/13/13 10:55 PM      Result Value Range   Heparin Unfractionated 0.58  0.30 - 0.70 IU/mL  PROTIME-INR      Status: Abnormal   Collection Time    04/14/13  6:00 AM      Result Value Range   Prothrombin Time 15.5 (*) 11.6 - 15.2 seconds   INR 1.26  0.00 - 1.49  CBC     Status: Abnormal   Collection Time    04/14/13  6:00 AM      Result Value Range   WBC 7.5  4.0 - 10.5 K/uL   RBC 4.22  4.22 - 5.81 MIL/uL   Hemoglobin 13.1  13.0 - 17.0 g/dL   HCT 78.2 (*) 95.6 - 21.3 %   MCV 91.5  78.0 - 100.0 fL   MCH 31.0  26.0 - 34.0 pg  MCHC 33.9  30.0 - 36.0 g/dL   RDW 87.5  64.3 - 32.9 %   Platelets 145 (*) 150 - 400 K/uL  BASIC METABOLIC PANEL     Status: Abnormal   Collection Time    04/14/13  6:00 AM      Result Value Range   Sodium 143  135 - 145 mEq/L   Potassium 4.0  3.5 - 5.1 mEq/L   Chloride 108  96 - 112 mEq/L   CO2 23  19 - 32 mEq/L   Glucose, Bld 103 (*) 70 - 99 mg/dL   BUN 10  6 - 23 mg/dL   Creatinine, Ser 5.18  0.50 - 1.35 mg/dL   Calcium 8.9  8.4 - 84.1 mg/dL   GFR calc non Af Amer 61 (*) >90 mL/min   GFR calc Af Amer 70 (*) >90 mL/min  HEPARIN LEVEL (UNFRACTIONATED)     Status: None   Collection Time    04/14/13  6:00 AM      Result Value Range   Heparin Unfractionated 0.55  0.30 - 0.70 IU/mL    Intake/Output Summary (Last 24 hours) at 04/14/13 0705 Last data filed at 04/14/13 0630  Gross per 24 hour  Intake  872.9 ml  Output   2315 ml  Net -1442.1 ml    ASSESSMENT AND PLAN:  CHEST PAIN:  Unstable angina.  Now s/p PCI of LAD with bare metal stent.  Continue risk reduction.   PULMONARY EMBOLI:  Warfarin resumed.  Restarted heparin until INR greater than 2.   Anthony Dodson [facility] 04/14/2013 7:05 AM

## 2013-04-14 NOTE — Progress Notes (Signed)
ANTICOAGULATION CONSULT NOTE - Follow-up Consult  Pharmacy Consult for Heparin Indication: Hx recurrent PE (bridge to coumadin)  No Known Allergies  Patient Measurements: Height: 5\' 9"  (175.3 cm) (Simultaneous filing. User may not have seen previous data.) Weight: 173 lb 4.8 oz (78.608 kg) IBW/kg (Calculated) : 70.7  Vital Signs: Temp: 98.6 F (37 C) (09/02 2017) Temp src: Oral (09/02 2017) BP: 150/87 mmHg (09/02 2017) Pulse Rate: 89 (09/02 2017)  Labs:  Recent Labs  04/11/13 0518 04/12/13 0445 04/12/13 1459 04/13/13 1425 04/13/13 1435 04/13/13 2255  HGB  --   --   --   --  12.9*  --   HCT  --   --   --   --  37.9*  --   PLT  --   --   --   --  163  --   LABPROT 24.5* 19.1*  --  16.6*  --   --   INR 2.29* 1.66*  --  1.38  --   --   HEPARINUNFRC  --   --  0.42  --   --  0.58  CREATININE 1.16 1.15  --   --   --   --     Estimated Creatinine Clearance: 57.2 ml/min (by C-G formula based on Cr of 1.15).  Assessment: 72yo male with history of recurrent PE (on coumadin PTA). Coumadin held for cath - now resumed post-cath. MD wishes to continue heparin bridge until INR therapeutic. Heparin level therapeutic on 1200 units/hr. No bleeding noted.  Goal of Therapy:  INR 2-3; Heparin level 0.3-0.7 Monitor platelets by anticoagulation protocol: Yes   Plan:  1.  Continue heparin 1200 units/hr 2. F/u a.m. heparin level  Christoper Fabian, PharmD, BCPS Clinical pharmacist, pager [phone] 04/14/2013   12:26 AM

## 2013-04-15 DIAGNOSIS — S3012XA Contusion of groin, initial encounter: Secondary | ICD-10-CM

## 2013-04-15 DIAGNOSIS — S301XXA Contusion of abdominal wall, initial encounter: Secondary | ICD-10-CM

## 2013-04-15 LAB — CBC
HCT: 36.5 % — ABNORMAL LOW (ref 39.0–52.0)
Hemoglobin: 12.2 g/dL — ABNORMAL LOW (ref 13.0–17.0)
MCH: 31.3 pg (ref 26.0–34.0)
RBC: 3.9 MIL/uL — ABNORMAL LOW (ref 4.22–5.81)

## 2013-04-15 LAB — PROTIME-INR: INR: 1.69 — ABNORMAL HIGH (ref 0.00–1.49)

## 2013-04-15 MED ORDER — MORPHINE SULFATE 2 MG/ML IJ SOLN
2.0000 mg | Freq: Once | INTRAMUSCULAR | Status: AC
  Administered 2013-04-15: 2 mg via INTRAVENOUS

## 2013-04-15 MED ORDER — WARFARIN SODIUM 2.5 MG PO TABS
2.5000 mg | ORAL_TABLET | Freq: Once | ORAL | Status: AC
Start: 2013-04-15 — End: 2013-04-15
  Administered 2013-04-15: 2.5 mg via ORAL
  Filled 2013-04-15: qty 1

## 2013-04-15 MED ORDER — MORPHINE SULFATE 2 MG/ML IJ SOLN
2.0000 mg | INTRAMUSCULAR | Status: DC | PRN
  Administered 2013-04-15 (×2): 2 mg via INTRAVENOUS
  Filled 2013-04-15 (×2): qty 1

## 2013-04-15 MED ORDER — SODIUM CHLORIDE 0.9 % IJ SOLN
10.0000 mL | INTRAMUSCULAR | Status: DC | PRN
  Administered 2013-04-15 (×2): 10 mL via INTRAVENOUS

## 2013-04-15 MED ORDER — MORPHINE SULFATE 2 MG/ML IJ SOLN
INTRAMUSCULAR | Status: AC
  Administered 2013-04-15: 2 mg via INTRAVENOUS
  Filled 2013-04-15: qty 1

## 2013-04-15 MED ORDER — SODIUM CHLORIDE 0.9 % IJ SOLN
3.0000 mL | Freq: Two times a day (BID) | INTRAMUSCULAR | Status: DC
  Administered 2013-04-15 – 2013-04-16 (×3): 3 mL via INTRAVENOUS

## 2013-04-15 MED ORDER — OXYCODONE-ACETAMINOPHEN 5-325 MG PO TABS
1.0000 | ORAL_TABLET | ORAL | Status: DC | PRN
  Administered 2013-04-15: 14:00:00 1 via ORAL
  Filled 2013-04-15 (×2): qty 1

## 2013-04-15 NOTE — Progress Notes (Signed)
Patient Name: Anthony Dodson Date of Encounter: 04/15/2013   Principal Problem:   Unstable angina Active Problems:   CAD (coronary artery disease)   Chronic pulmonary embolism   Hyperlipidemia   Hypertension   SUBJECTIVE  No chest pain or sob.  C/O R groin pain overnight and hematoma noted.  Manual pressure held and now stable, though still tender.  CURRENT MEDS . ALPRAZolam  1 mg Oral TID  . aspirin EC  81 mg Oral Daily  . atorvastatin  80 mg Oral q1800  . clopidogrel  75 mg Oral Q breakfast  . pantoprazole  40 mg Oral Q1200  . sodium chloride  3 mL Intravenous Q12H  . tamsulosin  0.4 mg Oral QPC supper  . Warfarin - Pharmacist Dosing Inpatient   Does not apply q1800   OBJECTIVE  Filed Vitals:   04/14/13 2000 04/14/13 2320 04/15/13 0022 04/15/13 0430  BP:  133/87  114/61  Pulse:  76  61  Temp:  98 F (36.7 C)  97.7 F (36.5 C)  TempSrc:  Oral  Oral  Resp:  18  16  Height:      Weight:   177 lb 0.5 oz (80.3 kg)   SpO2: 98% 100%  97%    Intake/Output Summary (Last 24 hours) at 04/15/13 0654 Last data filed at 04/14/13 1700  Gross per 24 hour  Intake    600 ml  Output    300 ml  Net    300 ml   Filed Weights   04/09/13 1613 04/14/13 0000 04/15/13 0022  Weight: 173 lb 4.8 oz (78.608 kg) 174 lb 6.1 oz (79.1 kg) 177 lb 0.5 oz (80.3 kg)   PHYSICAL EXAM  General: Pleasant, NAD. Neuro: Alert and oriented X 3. Moves all extremities spontaneously. Psych: Normal affect. HEENT:  Normal  Neck: Supple without bruits or JVD. Lungs:  Resp regular and unlabored, CTA. Heart: RRR no s3, s4, or murmurs. Abdomen: Soft, non-tender, non-distended, BS + x 4. No flank pain. Extremities: No clubbing, cyanosis or edema. DP/PT/Radials 2+ and equal bilaterally.  R groin ecchymotic and tender but soft.  No bruit or active bleeding.  Accessory Clinical Findings  CBC  Recent Labs  04/13/13 1435 04/14/13 0600  WBC 8.6 7.5  NEUTROABS 6.8  --   HGB 12.9* 13.1  HCT 37.9* 38.6*   MCV 92.4 91.5  PLT 163 145*   Basic Metabolic Panel  Recent Labs  04/14/13 0600  NA 143  K 4.0  CL 108  CO2 23  GLUCOSE 103*  BUN 10  CREATININE 1.16  CALCIUM 8.9   Lab Results  Component Value Date   INR 1.26 04/14/2013   INR 1.38 04/13/2013   INR 1.66* 04/12/2013   TELE  rsr/sb  Radiology/Studies  No results found.  ASSESSMENT AND PLAN  1.  USA/CAD:  S/p pci/bms to the LAD and OM1 on 9/2.  No chest pain overnight.  Cont asa, plavix (30d), statin.    2.  R groin hematoma: in setting of ongoing anticoagulation s/p cath.  Hold heparin.  Cont asa, plavix, coumadin.  F/U cbc and INR this AM.  Pt is tender.  Pain mgmt. Check u/s.  No flank pain.  3.  H/O PE:  Cont coumadin. F/u INR.  Hold heparin in setting of active bleeding/hematoma.  4.  HTN:  Stable.  5.  HL:  LDL 60 in 05/2012.  Cont statin.  Signed, Nicolasa Ducking NP  History and all data above reviewed.  Patient examined.  I agree with the findings as above.  No chest pain.  No SOBThe patient exam reveals COR:RRR  ,  Lungs: Clear  ,  Abd: Positive bowel sounds, no rebound no guarding, Ext Bruising at the right femoral without pulsatile mass or bruit.  Positive soft hematoma  .  All available labs, radiology testing, previous records reviewed. Agree with documented assessment and plan. Groin bleed.  Agree that we will continue the ASA/Plavix and warfarin.  If he rebleeds we will need to reverse the warfarin.  He has a history of recurrent PE however.  Doppler has been ordered.     Fayrene Fearing Karilyn Wind  12:29 PM  04/15/2013

## 2013-04-15 NOTE — Progress Notes (Signed)
Kept patient on bedrest today due to complication of right groin this morning several groin checks today no bleeding and soft to touch. He was assisted for a walk this evening and tolerated well. Right groin ecchymotic mostly soft to touch no signs of bleeding at this time slightly tender to touch mild pain compared this morning.

## 2013-04-15 NOTE — Progress Notes (Signed)
*  PRELIMINARY RESULTS* Vascular Ultrasound Right groin pseudoaneurysm check has been completed.  Preliminary findings: no evidence of pseudo.   Farrel Demark, RDMS, RVT  04/15/2013, 9:40 AM

## 2013-04-15 NOTE — Progress Notes (Signed)
ANTICOAGULATION CONSULT NOTE - Follow Up Consult  Pharmacy Consult for coumadin Indication: hx of PE  No Known Allergies  Patient Measurements: Height: 5\' 9"  (175.3 cm) (Simultaneous filing. User may not have seen previous data.) Weight: 177 lb 0.5 oz (80.3 kg) IBW/kg (Calculated) : 70.7 Heparin Dosing Weight:   Vital Signs: Temp: 97.7 F (36.5 C) (09/04 0854) Temp src: Oral (09/04 0854) BP: 131/69 mmHg (09/04 0854) Pulse Rate: 67 (09/04 0854)  Labs:  Recent Labs  04/13/13 1425 04/13/13 1435 04/13/13 2255 04/14/13 0600 04/15/13 0802  HGB  --  12.9*  --  13.1  --   HCT  --  37.9*  --  38.6*  --   PLT  --  163  --  145*  --   LABPROT 16.6*  --   --  15.5* 19.4*  INR 1.38  --   --  1.26 1.69*  HEPARINUNFRC  --   --  0.58 0.55 0.57  CREATININE  --   --   --  1.16  --     Estimated Creatinine Clearance: 56.7 ml/min (by C-G formula based on Cr of 1.16).   Medications:  Scheduled:  . ALPRAZolam  1 mg Oral TID  . aspirin EC  81 mg Oral Daily  . atorvastatin  80 mg Oral q1800  . clopidogrel  75 mg Oral Q breakfast  . pantoprazole  40 mg Oral Q1200  . sodium chloride  3 mL Intravenous Q12H  . tamsulosin  0.4 mg Oral QPC supper  . Warfarin - Pharmacist Dosing Inpatient   Does not apply q1800   Infusions:    Assessment: 72 yo male with hx of PE is currently on subtherapeutic coumadin.  Heparin was discontinued by team due to right groin hematoma.  INR however jumped to 1.69 from 1.26 after one dose of coumadin 7.5mg  yesterday. Home dose of coumadin was 5mg  daily. Goal of Therapy:  INR 2-3 Monitor platelets by anticoagulation protocol: Yes   Plan:  1) Coumadin 2.5mg  po x1 2) INR in am  Anthony Dodson, Tsz-Yin 04/15/2013,9:34 AM

## 2013-04-15 NOTE — Progress Notes (Signed)
Discussed with pt and wife education. Voiced understanding. 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 Printed information on tobaccoless chew/dip. Will f/u tomorrow for ambulation. [id number] Ethelda Chick CES, ACSM 3:26 PM 04/15/2013

## 2013-04-15 NOTE — Progress Notes (Signed)
I am continuing to monitor right groin it is still ecchymotic mostly soft to touch with pea sized hematoma tender to touch remains in bed this morning 2D echo done. PRN medicine used for pain control.

## 2013-04-16 ENCOUNTER — Encounter (HOSPITAL_COMMUNITY): Payer: Self-pay | Admitting: Nurse Practitioner

## 2013-04-16 LAB — CBC
Hemoglobin: 12.2 g/dL — ABNORMAL LOW (ref 13.0–17.0)
MCH: 30.4 pg (ref 26.0–34.0)
RBC: 4.01 MIL/uL — ABNORMAL LOW (ref 4.22–5.81)

## 2013-04-16 LAB — PROTIME-INR
INR: 1.8 — ABNORMAL HIGH (ref 0.00–1.49)
Prothrombin Time: 20.4 seconds — ABNORMAL HIGH (ref 11.6–15.2)

## 2013-04-16 MED ORDER — WARFARIN SODIUM 5 MG PO TABS
5.0000 mg | ORAL_TABLET | Freq: Once | ORAL | Status: DC
  Filled 2013-04-16: qty 1

## 2013-04-16 MED ORDER — ATORVASTATIN CALCIUM 80 MG PO TABS: 80.0000 mg | ORAL_TABLET | Freq: Every day | ORAL | Status: DC

## 2013-04-16 MED ORDER — CLOPIDOGREL BISULFATE 75 MG PO TABS: 75.0000 mg | ORAL_TABLET | Freq: Every day | ORAL | Status: DC

## 2013-04-16 MED ORDER — TRAMADOL HCL 50 MG PO TABS: 50.0000 mg | ORAL_TABLET | Freq: Three times a day (TID) | ORAL | Status: DC | PRN

## 2013-04-16 NOTE — Progress Notes (Signed)
ANTICOAGULATION CONSULT NOTE - Follow Up Consult  Pharmacy Consult for coumadin Indication: pulmonary embolus  No Known Allergies  Patient Measurements: Height: 5\' 9"  (175.3 cm) (Simultaneous filing. User may not have seen previous data.) Weight: 175 lb 7.8 oz (79.6 kg) IBW/kg (Calculated) : 70.7 Heparin Dosing Weight:   Vital Signs: Temp: 97.6 F (36.4 C) (09/05 0756) Temp src: Oral (09/05 0756) BP: 123/79 mmHg (09/05 0756) Pulse Rate: 58 (09/05 0756)  Labs:  Recent Labs  04/13/13 2255 04/14/13 0600 04/15/13 0802 04/15/13 1220 04/16/13 0435  HGB  --  13.1  --  12.2* 12.2*  HCT  --  38.6*  --  36.5* 37.2*  PLT  --  145*  --  147* 152  LABPROT  --  15.5* 19.4*  --  20.4*  INR  --  1.26 1.69*  --  1.80*  HEPARINUNFRC 0.58 0.55 0.57  --   --   CREATININE  --  1.16  --   --   --     Estimated Creatinine Clearance: 56.7 ml/min (by C-G formula based on Cr of 1.16).   Medications:  Scheduled:  . ALPRAZolam  1 mg Oral TID  . aspirin EC  81 mg Oral Daily  . atorvastatin  80 mg Oral q1800  . clopidogrel  75 mg Oral Q breakfast  . pantoprazole  40 mg Oral Q1200  . sodium chloride  3 mL Intravenous Q12H  . tamsulosin  0.4 mg Oral QPC supper  . Warfarin - Pharmacist Dosing Inpatient   Does not apply q1800   Infusions:    Assessment: 72 yo male with hx of PE is currently on subtherapeutic coumadin.  INR is climbing up nicely to 1.8.  Home dose is 5mg  po daily.  Heparin was discontinued due to hematoma. Goal of Therapy:  INR 2-3 Monitor platelets by anticoagulation protocol: Yes   Plan:  1) Coumadin 5mg  po x1 2) INR in am if still here  Hailey Miles, Tsz-Yin 04/16/2013,8:47 AM

## 2013-04-16 NOTE — Discharge Summary (Signed)
Patient ID: Anthony Dodson,  MRN: [id number], DOB/AGE: Jun 28, 1941 72 y.o.  Admit date: 04/09/2013 Discharge date: 04/16/2013  Primary Care Provider: TAPPER,DAVID B Primary Cardiologist: J. Diarra Ceja, MD [facility])  Discharge Diagnoses Principal Problem:   Unstable angina  **s/p PCI and bare-metal stenting of the LAD and RCA this admission.  Active Problems:   CAD (coronary artery disease)   Chronic pulmonary embolism  **chronic coumadin.   Hypertension   Groin hematoma  **prevented further bridging with heparin.   Hyperlipidemia  Allergies No Known Allergies  Procedures  Cardiac Catheterization and Percutaneous Coronary Intervention 9.2.2014  PROCEDURAL FINDINGS Hemodynamics: AO 122/68 with a mean of 90 mm mercury LV 128/15 mmHg  Coronary angiography: Coronary dominance: right  Left mainstem: The left main coronary is short and moderately calcified. It is without significant disease. Left anterior descending (LAD): The left anterior descending artery is a large vessel extending to the apex. It is moderately calcified in the proximal vessel. In the proximal vessel there is diffuse 30% narrowing. In the mid vessel there is an 80% focal stenosis. In the mid to distal vessel there is a segmental 50-70% stenosis.   **The mid-LAD was successfully stented using a 3.0 x 16 mm Rebel bare metal stent.**  Left circumflex (LCx): The left circumflex gives rise to 2 marginal branches. The first obtuse marginal vessel has a 90% stenosis proximally. The remainder of the left circumflex is without significant disease.   **The OM1 was successfully stented using a 2.5 x 12 mm Rebel bare metal stent.**  Right coronary artery (RCA): The right coronary is a dominant vessel. At the site of the previous stents in the proximal vessel is widely patent with less than 20% irregularities. The remainder of the vessels without obstructive disease. Left ventriculography: Left ventricular systolic function is  normal, LVEF is estimated at 55-60%, there is mild basilar inferior hypokinesis, there is no significant mitral regurgitation   Final Conclusions:   1. 2 vessel obstructive coronary disease. Continued patency of the stent in the RCA. 2. Normal LV function. 3. Abnormal FFR of the mid LAD 4. Successful stenting of the mid LAD and the first obtuse marginal vessel with bare-metal stents. _____________  Right Groin Ultrasound 9.4.2014  Summary: No evidence of a pseudoaneurysm of the right groin. _____________   History of Present Illness  72 year old male with prior history of coronary artery disease status post inferior myocardial infarction in 2013 with subsequent ML stenting of the right coronary artery. He also has a history of recurrent pulmonary embolism and is on chronic Coumadin anticoagulation. On the evening prior to admission, he developed substernal chest discomfort associated with nausea. This was partially nitrate responsive. He called EMS and was taken to [facility] where initial cardiac markers were negative. He was seen by cardiology and recommendation was made for transfer to [facility] for cardiac catheterization. He was bradycardic in his home dose of beta blocker was discontinued.  Hospital Course  Following arrival at [facility], she was pain-free. Coumadin was held and he was bridged with heparin given history of recurrent pulmonary embolus. He ruled out for myocardial infarction and underwent diagnostic cardiac catheterization on September 2 revealing severe LAD and right coronary artery disease. Previously placed stent in the right coronary artery was widely patent. Films were reviewed and he subsequently underwent successful percutaneous intervention with bare-metal stenting of the LAD and right coronary artery. He tolerated this procedure well and post procedure, following sheath removal, heparin and Coumadin were resumed with a  plan to keep him in the hospital until  INR was therapeutic as he did not felt comfortable giving himself Lovenox injections at home. He had no further chest discomfort and ambulated with cardiac rehabilitation. Unfortunately, he developed groin discomfort with ambulation and was later noted to have a groin hematoma at the site of catheterization. There was initially slight bleeding associated with this hematoma and after several minutes of manual pressure, bleeding resolved and hematoma was successfully compressed and soft. Due to development of hematoma on September 4, heparin was held and right groin ultrasound was performed and was negative for pseudoaneurysm. He has since been able to ambulate and continues to have mild right groin discomfort though the area is stable. He has had no further chest discomfort. His INR is 1.8 this morning and we plan to discharge him home in good condition. He'll need followup INR with his primary care provider in approximately one week. He will remain on Plavix therapy for 30 days.  Discharge Vitals Blood pressure 123/79, pulse 58, temperature 97.6 F (36.4 C), temperature source Oral, resp. rate 20, height 5\' 9"  (1.753 m), weight 175 lb 7.8 oz (79.6 kg), SpO2 95.00%.  Filed Weights   04/14/13 0000 04/15/13 0022 04/16/13 0400  Weight: 174 lb 6.1 oz (79.1 kg) 177 lb 0.5 oz (80.3 kg) 175 lb 7.8 oz (79.6 kg)   Labs  CBC  Recent Labs  04/13/13 1435  04/15/13 1220 04/16/13 0435  WBC 8.6  < > 6.1 6.8  NEUTROABS 6.8  --   --   --   HGB 12.9*  < > 12.2* 12.2*  HCT 37.9*  < > 36.5* 37.2*  MCV 92.4  < > 93.6 92.8  PLT 163  < > 147* 152  < > = values in this interval not displayed. Basic Metabolic Panel  Recent Labs  04/14/13 0600  NA 143  K 4.0  CL 108  CO2 23  GLUCOSE 103*  BUN 10  CREATININE 1.16  CALCIUM 8.9   Disposition  Pt is being discharged home today in good condition.  Follow-up Plans & Appointments  Follow-up Information   Follow up with Rollene Rotunda, MD On 05/05/2013.  (3:00 PM)    Contact information:   [facility] [place] [facility] [address] [place], [place] [address] [phone]      Follow up with TAPPER,DAVID B, MD In 1 week. (for f/u INR (coumadin check))    Specialty:  Unknown Physician Specialty   Contact information:   [address], [place] [address] [phone]      Discharge Medications    Medication List    STOP taking these medications       metoprolol succinate 25 MG 24 hr tablet  Commonly known as:  TOPROL-XL      TAKE these medications       ALPRAZolam 1 MG tablet  Commonly known as:  XANAX  Take 1 mg by mouth 3 (three) times daily as needed for anxiety. For anxiety     aspirin EC 81 MG tablet  Take 1 tablet (81 mg total) by mouth daily.     atorvastatin 80 MG tablet  Commonly known as:  LIPITOR  Take 1 tablet (80 mg total) by mouth daily.     clopidogrel 75 MG tablet  Commonly known as:  PLAVIX  Take 1 tablet (75 mg total) by mouth daily with breakfast.     GLUCOSAMINE PO  Take 1 tablet by mouth daily.  nitroGLYCERIN 0.4 MG SL tablet  Commonly known as:  NITROSTAT  Place 1 tablet (0.4 mg total) under the tongue every 5 (five) minutes x 3 doses as needed for chest pain.     pantoprazole 40 MG tablet  Commonly known as:  PROTONIX  Take 1 tablet (40 mg total) by mouth daily at 6 (six) AM.     tamsulosin 0.4 MG Caps capsule  Commonly known as:  FLOMAX  Take 0.4 mg by mouth daily.     traMADol 50 MG tablet  Commonly known as:  ULTRAM  Take 1 tablet (50 mg total) by mouth every 8 (eight) hours as needed for pain (groin pain).     vitamin B-12 1000 MCG tablet  Commonly known as:  CYANOCOBALAMIN  Take 1,000 mcg by mouth daily.     VITAMIN D PO  Take 1 tablet by mouth daily.     warfarin 5 MG tablet  Commonly known as:  COUMADIN  Take 5 mg by mouth daily.      Outstanding Labs/Studies  Follow-up INR in 1 week.  Duration of Discharge Encounter   Greater than 30  minutes including physician time.  Signed, Nicolasa Ducking NP 04/16/2013, 10:55 AM   Patient seen and examined.  Plan as discussed in my rounding note for today and outlined above. Fayrene Fearing Ethne Jeon  04/16/2013  1:12 PM

## 2013-04-16 NOTE — Progress Notes (Signed)
   SUBJECTIVE:  No chest pain again last night.  No SOB   PHYSICAL EXAM Filed Vitals:   04/15/13 2000 04/15/13 2352 04/16/13 0400 04/16/13 0756  BP: 144/89 152/89 133/70 123/79  Pulse: 68 69 65 58  Temp:  98.1 F (36.7 C) 98.1 F (36.7 C) 97.6 F (36.4 C)  TempSrc:  Oral Oral Oral  Resp: 18 20 19 20   Height:      Weight:   175 lb 7.8 oz (79.6 kg)   SpO2: 96% 98% 95% 95%   General:  No distress Lungs:  Clear Heart:  RRR Abdomen:  Positive bowel sounds, no rebound no guarding Extremities:  No edema, right groin with very small hematoma and echymosis.   LABS:  Results for orders placed during the hospital encounter of 04/09/13 (from the past 24 hour(s))  CBC     Status: Abnormal   Collection Time    04/15/13 12:20 PM      Result Value Range   WBC 6.1  4.0 - 10.5 K/uL   RBC 3.90 (*) 4.22 - 5.81 MIL/uL   Hemoglobin 12.2 (*) 13.0 - 17.0 g/dL   HCT 16.1 (*) 09.6 - 04.5 %   MCV 93.6  78.0 - 100.0 fL   MCH 31.3  26.0 - 34.0 pg   MCHC 33.4  30.0 - 36.0 g/dL   RDW 40.9  81.1 - 91.4 %   Platelets 147 (*) 150 - 400 K/uL  PROTIME-INR     Status: Abnormal   Collection Time    04/16/13  4:35 AM      Result Value Range   Prothrombin Time 20.4 (*) 11.6 - 15.2 seconds   INR 1.80 (*) 0.00 - 1.49  CBC     Status: Abnormal   Collection Time    04/16/13  4:35 AM      Result Value Range   WBC 6.8  4.0 - 10.5 K/uL   RBC 4.01 (*) 4.22 - 5.81 MIL/uL   Hemoglobin 12.2 (*) 13.0 - 17.0 g/dL   HCT 78.2 (*) 95.6 - 21.3 %   MCV 92.8  78.0 - 100.0 fL   MCH 30.4  26.0 - 34.0 pg   MCHC 32.8  30.0 - 36.0 g/dL   RDW 08.6  57.8 - 46.9 %   Platelets 152  150 - 400 K/uL    Intake/Output Summary (Last 24 hours) at 04/16/13 0818 Last data filed at 04/16/13 0500  Gross per 24 hour  Intake    600 ml  Output   1000 ml  Net   -400 ml    ASSESSMENT AND PLAN:  CHEST PAIN:  Unstable angina.  Now s/p PCI of LAD with bare metal stent.  Continue risk reduction.   PULMONARY EMBOLI: Off of heparin  with groin bleed but we have continued the warfarin.  INR is 1.8.    GROIN BLEED:  Ultrasound without pseudoaneurysm.  Hbg stable.  OK to go home with PO pain med (Lortab).  See me in [place] in two weeks.    Fayrene Fearing [facility] 04/16/2013 8:18 AM

## 2013-04-19 ENCOUNTER — Telehealth: Payer: Self-pay | Admitting: Nurse Practitioner

## 2013-04-19 MED ORDER — PANTOPRAZOLE SODIUM 40 MG PO TBEC: 40.0000 mg | DELAYED_RELEASE_TABLET | Freq: Every day | ORAL | Status: DC

## 2013-04-19 NOTE — Telephone Encounter (Signed)
Pts wife called and spoke with director of [facility] re: switch from prilosec to protonix on discharge from hospital last Friday.  I called pts wife back and the call went to voicemail.  I left a message explaining that that switch was made due to possible interactions between prilosec (omeprazole) and plavix.

## 2013-05-05 ENCOUNTER — Encounter: Payer: Self-pay | Admitting: Cardiology

## 2013-05-05 ENCOUNTER — Ambulatory Visit (INDEPENDENT_AMBULATORY_CARE_PROVIDER_SITE_OTHER): Payer: Medicare Other | Admitting: Cardiology

## 2013-05-05 VITALS — BP 134/81 | HR 59 | Ht 69.0 in | Wt 175.0 lb

## 2013-05-05 DIAGNOSIS — I2510 Atherosclerotic heart disease of native coronary artery without angina pectoris: Secondary | ICD-10-CM

## 2013-05-05 NOTE — Patient Instructions (Addendum)
Please stop Plavix May 13, 2013 Continue all other medications as listed  Follow up in 3 months with Dr Antoine Poche in [place].

## 2013-05-05 NOTE — Progress Notes (Signed)
HPI The patient presents for followup after PCI and bare-metal stenting of the LAD and circumflex late last month. He presented with chest discomfort and was subsequently found to have disease as described below.  He did have bare-metal stenting to the circumflex and in the LAD.  However, he did have some groin bleeding after the procedure. This was in part because he had to be restarted on heparin and warfarin for chronic pulmonary emboli. He said a little discomfort in his groin since then but it is slowly resolving. He's been getting around and doing some activities. With this he denies any chest pressure, neck or arm discomfort. He has had no palpitations, presyncope or syncope. He has had no shortness of breath, PND or orthopnea. He's had no weight gain or edema.  No Known Allergies  Current Outpatient Prescriptions  Medication Sig Dispense Refill  . ALPRAZolam (XANAX) 1 MG tablet Take 1 mg by mouth 3 (three) times daily as needed for anxiety. For anxiety      . aspirin EC 81 MG tablet Take 1 tablet (81 mg total) by mouth daily.  30 tablet  6  . atorvastatin (LIPITOR) 80 MG tablet Take 1 tablet (80 mg total) by mouth daily.  30 tablet  6  . Cholecalciferol (VITAMIN D PO) Take 1 tablet by mouth daily.      . clopidogrel (PLAVIX) 75 MG tablet Take 1 tablet (75 mg total) by mouth daily with breakfast.  30 tablet  6  . GLUCOSAMINE PO Take 1 tablet by mouth daily.       . nitroGLYCERIN (NITROSTAT) 0.4 MG SL tablet Place 1 tablet (0.4 mg total) under the tongue every 5 (five) minutes x 3 doses as needed for chest pain.  25 tablet  3  . traMADol (ULTRAM) 50 MG tablet Take 1 tablet (50 mg total) by mouth every 8 (eight) hours as needed for pain (groin pain).  30 tablet  0  . vitamin B-12 (CYANOCOBALAMIN) 1000 MCG tablet Take 1,000 mcg by mouth daily.      Marland Kitchen warfarin (COUMADIN) 5 MG tablet Take 5 mg by mouth daily.       . pantoprazole (PROTONIX) 40 MG tablet Take 1 tablet (40 mg total) by mouth  daily at 6 (six) AM.  30 tablet  6   No current facility-administered medications for this visit.    Past Medical History  Diagnosis Date  . 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 GERD (gastroesophageal reflux disease)   . Anxiety   . Cancer     a. 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 CAD (coronary artery disease)     a. 05/2012 Acute Inf/Post MI, Cath/PCI: LM nl, LAD 50p, 27m, 70d, LCX nl, OM1 95ost, OM2 nl, RCA 100ost (2.75x20 Veriflex BMS), PDA 40p, EF 50%.  Residual dzs medically managed.;  b.05/2012 Echo: EF 60-65%, mild LVH;  c. 04/2013 Cath/PCI: LM nl, LAD 30p, 29m(3.0x16 Rebel BMS), 50-24m/d, LCX nl, OM1 90p(2.5x12 Rebel BMS), RCA 20p ISR, EF 55-60.  Marland Kitchen Smokeless tobacco use     a. chew  . Hypertension     Past Surgical History  Procedure Laterality Date  . Head and neck surgery    . Cholecystectomy    . Kidney stone surgery    . Inguinal hernia repair      x 2    ROS:  As stated in the HPI and negative  for all other systems.  PHYSICAL EXAM BP 134/81  Pulse 59  Ht 5\' 9"  (1.753 m)  Wt 175 lb (79.379 kg)  BMI 25.83 kg/m2 GENERAL:  Well appearing HEENT:  Pupils equal round and reactive, fundi not visualized, oral mucosa unremarkable, edentulous NECK:  No jugular venous distention, waveform within normal limits, carotid upstroke brisk and symmetric, no bruits, no thyromegaly LYMPHATICS:  No cervical, inguinal adenopathy LUNGS:  Clear to auscultation bilaterally BACK:  No CVA tenderness CHEST:  Unremarkable HEART:  PMI not displaced or sustained,S1 and S2 within normal limits, no S3, no S4, no clicks, no rubs, no murmurs ABD:  Flat, positive bowel sounds normal in frequency in pitch, no bruits, no rebound, no guarding, no midline pulsatile mass, no hepatomegaly, no splenomegaly EXT:  2 plus pulses throughout, no edema, no cyanosis no clubbing,, mild bruising in the right thigh.   SKIN:  No rashes no nodules NEURO:   Cranial nerves II through XII grossly intact, motor grossly intact throughout PSYCH:  Cognitively intact, oriented to person place and time   EKG:   Sinus rhythm, rate 56, right bundle branch block, inferior infarct, nonspecific inferior T wave changes. No change from previous. 05/05/2013  ASSESSMENT AND PLAN  CAD:  The patient is doing well and he will be able to stop his Plavix in early October as these are bare-metal stents. He will remain on aspirin and warfarin.  WARFARIN THERAPY:  This is followed by TAPPER,DAVID B, MD.  He says he has had recent blood work. I would suggest followup with a CBC at the next lab visit if this has not been done.

## 2013-05-25 ENCOUNTER — Ambulatory Visit: Payer: Medicare Other | Admitting: Cardiology

## 2013-07-28 ENCOUNTER — Encounter: Payer: Self-pay | Admitting: Cardiology

## 2013-07-28 ENCOUNTER — Ambulatory Visit (INDEPENDENT_AMBULATORY_CARE_PROVIDER_SITE_OTHER): Payer: Medicare Other | Admitting: Cardiology

## 2013-07-28 VITALS — BP 128/75 | HR 75 | Ht 69.0 in | Wt 175.0 lb

## 2013-07-28 DIAGNOSIS — I2510 Atherosclerotic heart disease of native coronary artery without angina pectoris: Secondary | ICD-10-CM

## 2013-07-28 DIAGNOSIS — I10 Essential (primary) hypertension: Secondary | ICD-10-CM

## 2013-07-28 NOTE — Progress Notes (Signed)
HPI The patient presents for followup after PCI and bare-metal stenting of the LAD and circumflex in Sept 2013. He presented with chest discomfort and was subsequently found to have disease as described below.  He did have bare-metal stenting to the circumflex and in the LAD.  He's been getting around and doing some activities but he is not walking and is not dancing.   With his usual activities he denies any chest pressure, neck or arm discomfort. He has had no palpitations, presyncope or syncope. He has had no shortness of breath, PND or orthopnea. He's had no weight gain or edema.  No Known Allergies  Current Outpatient Prescriptions  Medication Sig Dispense Refill  . ALPRAZolam (XANAX) 1 MG tablet Take 1 mg by mouth 3 (three) times daily as needed for anxiety. For anxiety      . aspirin EC 81 MG tablet Take 1 tablet (81 mg total) by mouth daily.  30 tablet  6  . atorvastatin (LIPITOR) 80 MG tablet Take 1 tablet (80 mg total) by mouth daily.  30 tablet  6  . Cholecalciferol (VITAMIN D PO) Take 1 tablet by mouth daily.      . clopidogrel (PLAVIX) 75 MG tablet Take 1 tablet (75 mg total) by mouth daily with breakfast.  30 tablet  6  . GLUCOSAMINE PO Take 1 tablet by mouth daily.       . nitroGLYCERIN (NITROSTAT) 0.4 MG SL tablet Place 1 tablet (0.4 mg total) under the tongue every 5 (five) minutes x 3 doses as needed for chest pain.  25 tablet  3  . pantoprazole (PROTONIX) 40 MG tablet Take 1 tablet (40 mg total) by mouth daily at 6 (six) AM.  30 tablet  6  . traMADol (ULTRAM) 50 MG tablet Take 1 tablet (50 mg total) by mouth every 8 (eight) hours as needed for pain (groin pain).  30 tablet  0  . vitamin B-12 (CYANOCOBALAMIN) 1000 MCG tablet Take 1,000 mcg by mouth daily.      Marland Kitchen warfarin (COUMADIN) 5 MG tablet Take 5 mg by mouth daily.       Marland Kitchen HYDROcodone-acetaminophen (VICODIN) 5-500 MG per tablet        No current facility-administered medications for this visit.    Past Medical  History  Diagnosis Date  . 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 GERD (gastroesophageal reflux disease)   . Anxiety   . Cancer     a. 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 CAD (coronary artery disease)     a. 05/2012 Acute Inf/Post MI, Cath/PCI: LM nl, LAD 50p, 103m, 70d, LCX nl, OM1 95ost, OM2 nl, RCA 100ost (2.75x20 Veriflex BMS), PDA 40p, EF 50%.  Residual dzs medically managed.;  b.05/2012 Echo: EF 60-65%, mild LVH;  c. 04/2013 Cath/PCI: LM nl, LAD 30p, 19m(3.0x16 Rebel BMS), 50-59m/d, LCX nl, OM1 90p(2.5x12 Rebel BMS), RCA 20p ISR, EF 55-60.  Marland Kitchen Smokeless tobacco use     a. chew  . Hypertension     Past Surgical History  Procedure Laterality Date  . Head and neck surgery    . Cholecystectomy    . Kidney stone surgery    . Inguinal hernia repair      x 2    ROS:  As stated in the HPI and negative for all other systems.  PHYSICAL EXAM BP 128/75  Pulse 75  Ht 5\' 9"  (  1.753 m)  Wt 175 lb (79.379 kg)  BMI 25.83 kg/m2 GENERAL:  Well appearing HEENT:  Pupils equal round and reactive, fundi not visualized, oral mucosa unremarkable, edentulous NECK:  No jugular venous distention, waveform within normal limits, carotid upstroke brisk and symmetric, no bruits, no thyromegaly LUNGS:  Clear to auscultation bilaterally CHEST:  Unremarkable HEART:  PMI not displaced or sustained,S1 and S2 within normal limits, no S3, no S4, no clicks, no rubs, no murmurs ABD:  Flat, positive bowel sounds normal in frequency in pitch, no bruits, no rebound, no guarding, no midline pulsatile mass, no hepatomegaly, no splenomegaly EXT:  2 plus pulses throughout, no edema, no cyanosis no clubbing,, mild bruising in the right thigh.    ASSESSMENT AND PLAN  CAD:  He stopped his Plavix as instructed. He will continue however his aspirin and warfarin given his extensive coronary and pulmonary emboli history.  WARFARIN THERAPY:  This is followed by TAPPER,DAVID  B, MD.    TOBACCO USE:  I tell him every time to stop chewing.

## 2013-07-28 NOTE — Patient Instructions (Signed)
The current medical regimen is effective;  continue present plan and medications.  Follow up in 6 months with Dr. Hochrein in [place].  You will receive a letter in the mail 2 months before you are due.  Please call us when you receive this letter to schedule your follow up appointment.  

## 2013-08-09 ENCOUNTER — Other Ambulatory Visit (HOSPITAL_COMMUNITY): Payer: Self-pay | Admitting: Nurse Practitioner

## 2014-01-31 ENCOUNTER — Telehealth: Payer: Self-pay | Admitting: Cardiology

## 2014-01-31 NOTE — Telephone Encounter (Signed)
He will need bridging with Lovenox.

## 2014-01-31 NOTE — Telephone Encounter (Signed)
Will fax information to Dr Exie Parody Attn: Howell Rucks

## 2014-01-31 NOTE — Telephone Encounter (Signed)
Spoke with wife - pt is needing approval to stop warfarin 7 days before his lithotripsy on July 1 - aware I will ask Dr Percival Spanish.

## 2014-02-02 ENCOUNTER — Telehealth: Payer: Self-pay | Admitting: *Deleted

## 2014-02-02 NOTE — Telephone Encounter (Signed)
Angie from Dr Fredrik Rigger office called to clarify orders r/t pt holding warfarin for upcoming lithotripsy.  originally a request to hold warfarin was received by our office and because we do not follow his warfarin it was sent to Dr Rayna Sexton office to follow up.  Then the patient's wife called - the message was sent to Dr Percival Spanish who said the patient needed to be bridged.  That information was faxed to Angie at Dr Fredrik Rigger office who is now calling frustrated because she has received two different answers.   I reviewed with Dr Percival Spanish who states that since Dr Scotty Court follows and he gave the order for to pt hold for 7 days without bridging the patient should follow that order. Called and left message for pt at the home # Called and spoke to pt and gave him the information to hold as instructed by Dr Scotty Court.

## 2014-02-23 ENCOUNTER — Encounter: Payer: Self-pay | Admitting: Cardiology

## 2014-02-23 ENCOUNTER — Ambulatory Visit (INDEPENDENT_AMBULATORY_CARE_PROVIDER_SITE_OTHER): Payer: Medicare Other | Admitting: Cardiology

## 2014-02-23 VITALS — BP 123/74 | HR 67 | Ht 69.0 in | Wt 166.0 lb

## 2014-02-23 DIAGNOSIS — I10 Essential (primary) hypertension: Secondary | ICD-10-CM

## 2014-02-23 DIAGNOSIS — I2510 Atherosclerotic heart disease of native coronary artery without angina pectoris: Secondary | ICD-10-CM

## 2014-02-23 NOTE — Patient Instructions (Signed)
The current medical regimen is effective;  continue present plan and medications.  Please have blood work drawn at [facility].  Order written and given to patient.  Follow up in 1 year with Dr Percival Spanish.  You will receive a letter in the mail 2 months before you are due.  Please call us when you receive this letter to schedule your follow up appointment.

## 2014-02-23 NOTE — Progress Notes (Signed)
HPI The patient presents for followup after PCI and bare-metal stenting of the LAD and circumflex in Sept 2013. He presented with chest discomfort and was subsequently found to have disease as described below.  He did have bare-metal stenting to the circumflex and in the LAD.    Since and I last saw him he has had problems with kidney stones. The blood thinners to have lithotripsy and I suggested bridging but he couldn't afford this. He otherwise has done well.  He's been getting around and doing some activities and is dancing.   With his usual activities he denies any chest pressure, neck or arm discomfort. He has had no palpitations, presyncope or syncope. He has had no shortness of breath, PND or orthopnea. He's had no weight gain or edema.  No Known Allergies  Current Outpatient Prescriptions  Medication Sig Dispense Refill  . ALPRAZolam (XANAX) 1 MG tablet Take 1 mg by mouth 3 (three) times daily as needed for anxiety. For anxiety      . aspirin EC 81 MG tablet Take 1 tablet (81 mg total) by mouth daily.  30 tablet  6  . Cholecalciferol (VITAMIN D PO) Take 1 tablet by mouth daily.      Marland Kitchen GLUCOSAMINE PO Take 1 tablet by mouth daily.       Marland Kitchen HYDROcodone-acetaminophen (VICODIN) 5-500 MG per tablet as needed.       . nitroGLYCERIN (NITROSTAT) 0.4 MG SL tablet Place 1 tablet (0.4 mg total) under the tongue every 5 (five) minutes x 3 doses as needed for chest pain.  25 tablet  3  . pantoprazole (PROTONIX) 40 MG tablet TAKE 1 TABLET BY MOUTH ONCE DAILY AT 6 AM  30 tablet  6  . traMADol (ULTRAM) 50 MG tablet Take 1 tablet (50 mg total) by mouth every 8 (eight) hours as needed for pain (groin pain).  30 tablet  0  . vitamin B-12 (CYANOCOBALAMIN) 1000 MCG tablet Take 1,000 mcg by mouth daily.      . vitamin C (ASCORBIC ACID) 500 MG tablet Take 500 mg by mouth daily.      Marland Kitchen warfarin (COUMADIN) 5 MG tablet Take 5 mg by mouth daily.        No current facility-administered medications for this  visit.    Past Medical History  Diagnosis Date  . 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 GERD (gastroesophageal reflux disease)   . Anxiety   . Cancer     a. 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 CAD (coronary artery disease)     a. 05/2012 Acute Inf/Post MI, Cath/PCI: LM nl, LAD 50p, 24m, 70d, LCX nl, OM1 95ost, OM2 nl, RCA 100ost (2.75x20 Veriflex BMS), PDA 40p, EF 50%.  Residual dzs medically managed.;  b.05/2012 Echo: EF 60-65%, mild LVH;  c. 04/2013 Cath/PCI: LM nl, LAD 30p, 78m(3.0x16 Rebel BMS), 50-11m/d, LCX nl, OM1 90p(2.5x12 Rebel BMS), RCA 20p ISR, EF 55-60.  Marland Kitchen Smokeless tobacco use     a. chew  . Hypertension     Past Surgical History  Procedure Laterality Date  . Head and neck surgery    . Cholecystectomy    . Kidney stone surgery    . Inguinal hernia repair      x 2    ROS:  As stated in the HPI and negative for all other systems.  PHYSICAL EXAM BP 123/74  Pulse 67  Ht 5\' 9"  (1.753 m)  Wt 166 lb (75.297 kg)  BMI 24.50 kg/m2 GENERAL:  Well appearing HEENT:  Pupils equal round and reactive, fundi not visualized, oral mucosa unremarkable, edentulous NECK:  No jugular venous distention, waveform within normal limits, carotid upstroke brisk and symmetric, no bruits, no thyromegaly LUNGS:  Clear to auscultation bilaterally CHEST:  Unremarkable HEART:  PMI not displaced or sustained,S1 and S2 within normal limits, no S3, no S4, no clicks, no rubs, no murmurs ABD:  Flat, positive bowel sounds normal in frequency in pitch, no bruits, no rebound, no guarding, no midline pulsatile mass, no hepatomegaly, no splenomegaly EXT:  2 plus pulses throughout, no edema, no cyanosis no clubbing,, mild bruising in the right thigh.    EKG:  Sinus rhythm, rate 61, axis within normal limits, right bundle branch block, no acute ST-T wave changes.  ASSESSMENT AND PLAN  CAD:  He stowill continue with aspirin and warfarin given his  extensive coronary and pulmonary emboli history.  No further cardiovascular testing is suggested.  WARFARIN THERAPY:  This is followed by TAPPER,DAVID B, MD.    TOBACCO USE:  I tell him every time to stop chewing.   RISK REDUCTION:  He does agree to consider a statin if his lipids are significantly abnormal. I will send him for a fasting lipid profile.

## 2014-03-16 ENCOUNTER — Telehealth: Payer: Self-pay | Admitting: Cardiology

## 2014-03-16 NOTE — Telephone Encounter (Signed)
New message      Patient want lab results taken at [facility] in [place]

## 2014-03-24 NOTE — Telephone Encounter (Signed)
Pt. Called and informed of his labs

## 2014-06-25 ENCOUNTER — Encounter (HOSPITAL_COMMUNITY): Payer: Self-pay | Admitting: Emergency Medicine

## 2014-06-25 ENCOUNTER — Emergency Department (HOSPITAL_COMMUNITY)
Admission: EM | Admit: 2014-06-25 | Discharge: 2014-06-26 | Disposition: A | Discharge status: Home / Self Care | Payer: Medicare Other | Attending: Emergency Medicine | Admitting: Emergency Medicine

## 2014-06-25 ENCOUNTER — Emergency Department (HOSPITAL_COMMUNITY): Payer: Medicare Other

## 2014-06-25 DIAGNOSIS — I10 Essential (primary) hypertension: Secondary | ICD-10-CM

## 2014-06-25 DIAGNOSIS — Z7901 Long term (current) use of anticoagulants: Secondary | ICD-10-CM | POA: Insufficient documentation

## 2014-06-25 DIAGNOSIS — I2510 Atherosclerotic heart disease of native coronary artery without angina pectoris: Secondary | ICD-10-CM | POA: Insufficient documentation

## 2014-06-25 DIAGNOSIS — Z7982 Long term (current) use of aspirin: Secondary | ICD-10-CM | POA: Insufficient documentation

## 2014-06-25 DIAGNOSIS — C760 Malignant neoplasm of head, face and neck: Secondary | ICD-10-CM | POA: Insufficient documentation

## 2014-06-25 DIAGNOSIS — F419 Anxiety disorder, unspecified: Secondary | ICD-10-CM | POA: Insufficient documentation

## 2014-06-25 DIAGNOSIS — Z720 Tobacco use: Secondary | ICD-10-CM | POA: Diagnosis not present

## 2014-06-25 DIAGNOSIS — Z86711 Personal history of pulmonary embolism: Secondary | ICD-10-CM | POA: Diagnosis not present

## 2014-06-25 DIAGNOSIS — K219 Gastro-esophageal reflux disease without esophagitis: Secondary | ICD-10-CM | POA: Insufficient documentation

## 2014-06-25 DIAGNOSIS — R519 Headache, unspecified: Secondary | ICD-10-CM

## 2014-06-25 DIAGNOSIS — Z79899 Other long term (current) drug therapy: Secondary | ICD-10-CM | POA: Insufficient documentation

## 2014-06-25 DIAGNOSIS — R51 Headache: Secondary | ICD-10-CM | POA: Diagnosis not present

## 2014-06-25 MED ORDER — SODIUM CHLORIDE 0.9 % IV SOLN
1000.0000 mL | INTRAVENOUS | Status: DC
  Administered 2014-06-26: 1000 mL via INTRAVENOUS

## 2014-06-25 MED ORDER — METOCLOPRAMIDE HCL 5 MG/ML IJ SOLN
10.0000 mg | Freq: Once | INTRAMUSCULAR | Status: AC
  Administered 2014-06-26: 10 mg via INTRAVENOUS
  Filled 2014-06-25: qty 2

## 2014-06-25 MED ORDER — DIPHENHYDRAMINE HCL 50 MG/ML IJ SOLN
25.0000 mg | Freq: Once | INTRAMUSCULAR | Status: AC
  Administered 2014-06-25: 25 mg via INTRAVENOUS
  Filled 2014-06-25: qty 1

## 2014-06-25 MED ORDER — SODIUM CHLORIDE 0.9 % IV SOLN
1000.0000 mL | Freq: Once | INTRAVENOUS | Status: AC
  Administered 2014-06-25: 1000 mL via INTRAVENOUS

## 2014-06-25 NOTE — ED Notes (Signed)
States having sharp pain radiating into neck

## 2014-06-25 NOTE — ED Notes (Signed)
Headache on and off x 1 week, worse tonight,   BP 202/112 this evening

## 2014-06-25 NOTE — ED Provider Notes (Signed)
CSN: [id number]     Arrival date & time 06/25/14  2240 History   First MD Initiated Contact with Patient 06/25/14 2341     This chart was scribed for Delora Fuel, MD by Forrestine Him, ED Scribe. This patient was seen in room APA10/APA10 and the patient's care was started 11:47 PM.   Chief Complaint  Patient presents with  . Headache  . Hypertension   The history is provided by the patient and a relative. No language interpreter was used.    HPI Comments: Anthony Dodson is a 73 y.o. male with a PMHx of GERD, CAD, and HTN who presents to the Emergency Department complaining of a constant, moderate HA to the occiput x 1 week that has progressively worsened this evening at approximately 9 PM. Currently he rates HA 8/10. Pt states HA is exacerbated with bright light and some what alleviating when closing both eyes. Blood pressure has been 202/112 this evening. No blurred vision or double vision. He denies any nausea or dizziness. Pt is currently on Warfarin daily for blood clots. No known allergies to medications.  He is followed by: Deloria Lair, MD temporarily for Primary Care He is followed by: Dr. Minus Breeding- Cardiology  Past Medical History  Diagnosis Date  . 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 GERD (gastroesophageal reflux disease)   . Anxiety   . Cancer     a. 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 CAD (coronary artery disease)     a. 05/2012 Acute Inf/Post MI, Cath/PCI: LM nl, LAD 50p, 75m, 70d, LCX nl, OM1 95ost, OM2 nl, RCA 100ost (2.75x20 Veriflex BMS), PDA 40p, EF 50%.  Residual dzs medically managed.;  b.05/2012 Echo: EF 60-65%, mild LVH;  c. 04/2013 Cath/PCI: LM nl, LAD 30p, 2m(3.0x16 Rebel BMS), 50-13m/d, LCX nl, OM1 90p(2.5x12 Rebel BMS), RCA 20p ISR, EF 55-60.  Marland Kitchen Smokeless tobacco use     a. chew  . Hypertension    Past Surgical History  Procedure Laterality Date  . Head and neck surgery    . Cholecystectomy    .  Kidney stone surgery    . Inguinal hernia repair      x 2   Family History  Problem Relation Age of Onset  . Lung disease Father     died in his 60's of black lung  . Stroke Mother     died in her 28's.   History  Substance Use Topics  . Smoking status: Never Smoker   . 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 Alcohol Use: No    Review of Systems  Constitutional: Negative for chills.  Eyes: Positive for photophobia. Negative for visual disturbance.  Gastrointestinal: Negative for nausea.  Neurological: Positive for headaches.  All other systems reviewed and are negative.     Allergies  Review of patient's allergies indicates no known allergies.  Home Medications   Prior to Admission medications   Medication Sig Start Date End Date Taking? Authorizing Provider  ALPRAZolam Duanne Moron) 1 MG tablet Take 1 mg by mouth 3 (three) times daily as needed for anxiety. For anxiety    Historical Provider, MD  aspirin EC 81 MG tablet Take 1 tablet (81 mg total) by mouth daily. 07/16/12   Minus Breeding, MD  Cholecalciferol (VITAMIN  D PO) Take 1 tablet by mouth daily.    Historical Provider, MD  GLUCOSAMINE PO Take 1 tablet by mouth daily.     Historical Provider, MD  HYDROcodone-acetaminophen (VICODIN) 5-500 MG per tablet as needed.  07/26/13   Historical Provider, MD  nitroGLYCERIN (NITROSTAT) 0.4 MG SL tablet Place 1 tablet (0.4 mg total) under the tongue every 5 (five) minutes x 3 doses as needed for chest pain. 11/17/12   Minus Breeding, MD  pantoprazole (PROTONIX) 40 MG tablet TAKE 1 TABLET BY MOUTH ONCE DAILY AT 6 AM 08/09/13   Minus Breeding, MD  traMADol (ULTRAM) 50 MG tablet Take 1 tablet (50 mg total) by mouth every 8 (eight) hours as needed for pain (groin pain). 04/16/13   Rogelia Mire, NP  vitamin B-12 (CYANOCOBALAMIN) 1000 MCG tablet Take 1,000 mcg by mouth daily.     Historical Provider, MD  vitamin C (ASCORBIC ACID) 500 MG tablet Take 500 mg by mouth daily.    Historical Provider, MD  warfarin (COUMADIN) 5 MG tablet Take 5 mg by mouth daily.     Historical Provider, MD   Triage Vitals: BP 168/109 mmHg  Pulse 66  Temp(Src) 98.6 F (37 C) (Oral)  Resp 18  Ht 5\' 9"  (1.753 m)  Wt 175 lb (79.379 kg)  BMI 25.83 kg/m2  SpO2 98%   Physical Exam  Constitutional: He is oriented to person, place, and time. He appears well-developed and well-nourished.  Appears mildly uncomfortable  HENT:  Head: Normocephalic and atraumatic.  Tenderness to palpation over temporalis muscles and insertion of paracervical muscles  Eyes: Conjunctivae and EOM are normal. Pupils are equal, round, and reactive to light.  Fundi normal  Neck: Normal range of motion. Neck supple. No JVD present.  Cardiovascular: Normal rate, regular rhythm and normal heart sounds.   No murmur heard. Pulmonary/Chest: Effort normal and breath sounds normal. He has no wheezes. He has no rales.  Abdominal: Soft. Bowel sounds are normal. He exhibits no distension and no mass. There is no tenderness.  Musculoskeletal: Normal range of motion. He exhibits no edema.  Lymphadenopathy:    He has no cervical adenopathy.  Neurological: He is alert and oriented to person, place, and time. He has normal reflexes. No cranial nerve deficit. Coordination normal.  Skin: Skin is warm and dry. No rash noted.  Psychiatric: He has a normal mood and affect. His behavior is normal. Thought content normal.  Nursing note and vitals reviewed.   ED Course  Procedures (including critical care time)  DIAGNOSTIC STUDIES: Oxygen Saturation is 98% on RA, Normal by my interpretation.    COORDINATION OF CARE: 11:46 PM- Will give fluids, Reglan, and Benadryl. Will order CBC, BMP, CT Head without contrast, and Protime-INR. Discussed treatment plan with pt at bedside and pt agreed to plan.     Labs Review Results for orders  placed or performed during the hospital encounter of 06/25/14  CBC with Differential  Result Value Ref Range   WBC 6.4 4.0 - 10.5 K/uL   RBC 4.39 4.22 - 5.81 MIL/uL   Hemoglobin 13.4 13.0 - 17.0 g/dL   HCT 40.3 39.0 - 52.0 %   MCV 91.8 78.0 - 100.0 fL   MCH 30.5 26.0 - 34.0 pg   MCHC 33.3 30.0 - 36.0 g/dL   RDW 14.3 11.5 - 15.5 %   Platelets 190 150 - 400 K/uL   Neutrophils Relative % 55 43 - 77 %   Neutro Abs 3.6 1.7 -  7.7 K/uL   Lymphocytes Relative 31 12 - 46 %   Lymphs Abs 2.0 0.7 - 4.0 K/uL   Monocytes Relative 10 3 - 12 %   Monocytes Absolute 0.6 0.1 - 1.0 K/uL   Eosinophils Relative 3 0 - 5 %   Eosinophils Absolute 0.2 0.0 - 0.7 K/uL   Basophils Relative 1 0 - 1 %   Basophils Absolute 0.0 0.0 - 0.1 K/uL  Basic metabolic panel  Result Value Ref Range   Sodium 140 137 - 147 mEq/L   Potassium 4.2 3.7 - 5.3 mEq/L   Chloride 103 96 - 112 mEq/L   CO2 24 19 - 32 mEq/L   Glucose, Bld 92 70 - 99 mg/dL   BUN 14 6 - 23 mg/dL   Creatinine, Ser 1.18 0.50 - 1.35 mg/dL   Calcium 8.9 8.4 - 10.5 mg/dL   GFR calc non Af Amer 59 (L) >90 mL/min   GFR calc Af Amer 68 (L) >90 mL/min   Anion gap 13 5 - 15  Protime-INR  Result Value Ref Range   Prothrombin Time 25.4 (H) 11.6 - 15.2 seconds   INR 2.29 (H) 0.00 - 1.49    Imaging Review Ct Head Wo Contrast  06/26/2014   CLINICAL DATA:  Hypertension and intermittent headache for 1 week. History of hypertension, head and neck cancer with jaw surgery.  EXAM: CT HEAD WITHOUT CONTRAST  TECHNIQUE: Contiguous axial images were obtained from the base of the skull through the vertex without intravenous contrast.  COMPARISON:  None.  FINDINGS: The ventricles and sulci are normal for age. No intraparenchymal hemorrhage, mass effect nor midline shift. Patchy supratentorial white matter hypodensities are within normal range for patient's age and though non-specific suggest sequelae of chronic small vessel ischemic disease. No acute large vascular  territory infarcts.  No abnormal extra-axial fluid collections. Basal cisterns are patent. Minimal calcific atherosclerosis of the carotid siphons.  No skull fracture. The included ocular globes and orbital contents are non-suspicious. The mastoid aircells and included paranasal sinuses are well-aerated. Patient is edentulous. Partially imaged RIGHT maxillary anterior wall and RIGHT lateral orbital wall ORIF. Surgical clip in the LEFT infratemporal fossa.  IMPRESSION: No acute intracranial process.  Involutional changes. Moderate RIGHT matter changes suggest chronic small vessel ischemic disease.   Electronically Signed   By: Elon Alas   On: 06/26/2014 01:03     MDM   Final diagnoses:  Headache, unspecified headache type  Essential hypertension  Anticoagulated on warfarin    Headache with hypertension. There is also some stiffness in his neck however this seems to be related to muscle spasm. He'll be given a headache cocktail and reassessed. However, with his being anticoagulated, he will be sent for CT of head to rule out spontaneous intracranial hemorrhage or subarachnoid hemorrhage.  Head CT is unremarkable and. He Good but not complete relief of headache with headache cocktail. INR is therapeutic. He is discharged with prescriptions for oxycodone acetaminophen and orphenadrine. Blood pressure has come down with improvement in headache and the elevation blood pressure seemed to reaction to pain. Headache seems to be a muscle contraction headache. He is advised to follow-up with his PCP in the next several days.  I personally performed the services described in this documentation, which was scribed in my presence. The recorded information has been reviewed and is accurate.    Delora Fuel, MD 98/92/11 9417

## 2014-06-26 DIAGNOSIS — R51 Headache: Secondary | ICD-10-CM | POA: Diagnosis not present

## 2014-06-26 LAB — BASIC METABOLIC PANEL
ANION GAP: 13 (ref 5–15)
BUN: 14 mg/dL (ref 6–23)
CALCIUM: 8.9 mg/dL (ref 8.4–10.5)
CHLORIDE: 103 meq/L (ref 96–112)
CO2: 24 mEq/L (ref 19–32)
CREATININE: 1.18 mg/dL (ref 0.50–1.35)
GFR, EST AFRICAN AMERICAN: 68 mL/min — AB (ref >90)
GFR, EST NON AFRICAN AMERICAN: 59 mL/min — AB (ref >90)
Glucose, Bld: 92 mg/dL (ref 70–99)
Potassium: 4.2 mEq/L (ref 3.7–5.3)
Sodium: 140 mEq/L (ref 137–147)

## 2014-06-26 LAB — PROTIME-INR
INR: 2.29 — ABNORMAL HIGH (ref 0.00–1.49)
Prothrombin Time: 25.4 seconds — ABNORMAL HIGH (ref 11.6–15.2)

## 2014-06-26 LAB — CBC WITH DIFFERENTIAL/PLATELET
BASOS ABS: 0 10*3/uL (ref 0.0–0.1)
BASOS PCT: 1 % (ref 0–1)
EOS ABS: 0.2 10*3/uL (ref 0.0–0.7)
EOS PCT: 3 % (ref 0–5)
HEMATOCRIT: 40.3 % (ref 39.0–52.0)
Hemoglobin: 13.4 g/dL (ref 13.0–17.0)
Lymphocytes Relative: 31 % (ref 12–46)
Lymphs Abs: 2 10*3/uL (ref 0.7–4.0)
MCH: 30.5 pg (ref 26.0–34.0)
MCHC: 33.3 g/dL (ref 30.0–36.0)
MCV: 91.8 fL (ref 78.0–100.0)
MONO ABS: 0.6 10*3/uL (ref 0.1–1.0)
Monocytes Relative: 10 % (ref 3–12)
Neutro Abs: 3.6 10*3/uL (ref 1.7–7.7)
Neutrophils Relative %: 55 % (ref 43–77)
Platelets: 190 10*3/uL (ref 150–400)
RBC: 4.39 MIL/uL (ref 4.22–5.81)
RDW: 14.3 % (ref 11.5–15.5)
WBC: 6.4 10*3/uL (ref 4.0–10.5)

## 2014-06-26 MED ORDER — MORPHINE SULFATE 4 MG/ML IJ SOLN
4.0000 mg | Freq: Once | INTRAMUSCULAR | Status: AC
  Administered 2014-06-26: 4 mg via INTRAVENOUS
  Filled 2014-06-26: qty 1

## 2014-06-26 MED ORDER — ORPHENADRINE CITRATE ER 100 MG PO TB12: 100.0000 mg | ORAL_TABLET | Freq: Two times a day (BID) | ORAL | Status: DC

## 2014-06-26 MED ORDER — OXYCODONE-ACETAMINOPHEN 5-325 MG PO TABS: 1.0000 | ORAL_TABLET | ORAL | Status: DC | PRN

## 2014-06-26 MED ORDER — CYCLOBENZAPRINE HCL 10 MG PO TABS
10.0000 mg | ORAL_TABLET | Freq: Once | ORAL | Status: AC
  Administered 2014-06-26: 10 mg via ORAL
  Filled 2014-06-26: qty 1

## 2014-06-26 NOTE — Discharge Instructions (Signed)
General Headache Without Cause A headache is pain or discomfort felt around the head or neck area. The specific cause of a headache may not be found. There are many causes and types of headaches. A few common ones are:  Tension headaches.  Migraine headaches.  Cluster headaches.  Chronic daily headaches. HOME CARE INSTRUCTIONS   Keep all follow-up appointments with your caregiver or any specialist referral.  Only take over-the-counter or prescription medicines for pain or discomfort as directed by your caregiver.  Lie down in a dark, quiet room when you have a headache.  Keep a headache journal to find out what may trigger your migraine headaches. For example, write down:  What you eat and drink.  How much sleep you get.  Any change to your diet or medicines.  Try massage or other relaxation techniques.  Put ice packs or heat on the head and neck. Use these 3 to 4 times per day for 15 to 20 minutes each time, or as needed.  Limit stress.  Sit up straight, and do not tense your muscles.  Quit smoking if you smoke.  Limit alcohol use.  Decrease the amount of caffeine you drink, or stop drinking caffeine.  Eat and sleep on a regular schedule.  Get 7 to 9 hours of sleep, or as recommended by your caregiver.  Keep lights dim if bright lights bother you and make your headaches worse. SEEK MEDICAL CARE IF:   You have problems with the medicines you were prescribed.  Your medicines are not working.  You have a change from the usual headache.  You have nausea or vomiting. SEEK IMMEDIATE MEDICAL CARE IF:   Your headache becomes severe.  You have a fever.  You have a stiff neck.  You have loss of vision.  You have muscular weakness or loss of muscle control.  You start losing your balance or have trouble walking.  You feel faint or pass out.  You have severe symptoms that are different from your first symptoms. MAKE SURE YOU:   Understand these  instructions.  Will watch your condition.  Will get help right away if you are not doing well or get worse. Document Released: 07/29/2005 Document Revised: 10/21/2011 Document Reviewed: 08/14/2011 [facility] Patient Information 2015 [place], [place]. This information is not intended to replace advice given to you by your health care provider. Make sure you discuss any questions you have with your health care provider.  Acetaminophen; Oxycodone tablets What is this medicine? ACETAMINOPHEN; OXYCODONE (a set a MEE noe fen; ox i KOE done) is a pain reliever. It is used to treat mild to moderate pain. This medicine may be used for other purposes; ask your health care provider or pharmacist if you have questions. COMMON BRAND NAME(S): Endocet, Magnacet, Narvox, Percocet, Perloxx, Primalev, Primlev, Roxicet, Xolox What should I tell my health care provider before I take this medicine? They need to know if you have any of these conditions: -brain tumor -Crohn's disease, inflammatory bowel disease, or ulcerative colitis -drug abuse or addiction -head injury -heart or circulation problems -if you often drink alcohol -kidney disease or problems going to the bathroom -liver disease -lung disease, asthma, or breathing problems -an unusual or allergic reaction to acetaminophen, oxycodone, other opioid analgesics, other medicines, foods, dyes, or preservatives -pregnant or trying to get pregnant -breast-feeding How should I use this medicine? Take this medicine by mouth with a full glass of water. Follow the directions on the prescription label. Take your medicine at  regular intervals. Do not take your medicine more often than directed. Talk to your pediatrician regarding the use of this medicine in children. Special care may be needed. Patients over 60 years old may have a stronger reaction and need a smaller dose. Overdosage: If you think you have taken too much of this medicine contact a poison  control center or emergency room at once. NOTE: This medicine is only for you. Do not share this medicine with others. What if I miss a dose? If you miss a dose, take it as soon as you can. If it is almost time for your next dose, take only that dose. Do not take double or extra doses. What may interact with this medicine? -alcohol -antihistamines -barbiturates like amobarbital, butalbital, butabarbital, methohexital, pentobarbital, phenobarbital, thiopental, and secobarbital -benztropine -drugs for bladder problems like solifenacin, trospium, oxybutynin, tolterodine, hyoscyamine, and methscopolamine -drugs for breathing problems like ipratropium and tiotropium -drugs for certain stomach or intestine problems like propantheline, homatropine methylbromide, glycopyrrolate, atropine, belladonna, and dicyclomine -general anesthetics like etomidate, ketamine, nitrous oxide, propofol, desflurane, enflurane, halothane, isoflurane, and sevoflurane -medicines for depression, anxiety, or psychotic disturbances -medicines for sleep -muscle relaxants -naltrexone -narcotic medicines (opiates) for pain -phenothiazines like perphenazine, thioridazine, chlorpromazine, mesoridazine, fluphenazine, prochlorperazine, promazine, and trifluoperazine -scopolamine -tramadol -trihexyphenidyl This list may not describe all possible interactions. Give your health care provider a list of all the medicines, herbs, non-prescription drugs, or dietary supplements you use. Also tell them if you smoke, drink alcohol, or use illegal drugs. Some items may interact with your medicine. What should I watch for while using this medicine? Tell your doctor or health care professional if your pain does not go away, if it gets worse, or if you have new or a different type of pain. You may develop tolerance to the medicine. Tolerance means that you will need a higher dose of the medication for pain relief. Tolerance is normal and is  expected if you take this medicine for a long time. Do not suddenly stop taking your medicine because you may develop a severe reaction. Your body becomes used to the medicine. This does NOT mean you are addicted. Addiction is a behavior related to getting and using a drug for a non-medical reason. If you have pain, you have a medical reason to take pain medicine. Your doctor will tell you how much medicine to take. If your doctor wants you to stop the medicine, the dose will be slowly lowered over time to avoid any side effects. You may get drowsy or dizzy. Do not drive, use machinery, or do anything that needs mental alertness until you know how this medicine affects you. Do not stand or sit up quickly, especially if you are an older patient. This reduces the risk of dizzy or fainting spells. Alcohol may interfere with the effect of this medicine. Avoid alcoholic drinks. There are different types of narcotic medicines (opiates) for pain. If you take more than one type at the same time, you may have more side effects. Give your health care provider a list of all medicines you use. Your doctor will tell you how much medicine to take. Do not take more medicine than directed. Call emergency for help if you have problems breathing. The medicine will cause constipation. Try to have a bowel movement at least every 2 to 3 days. If you do not have a bowel movement for 3 days, call your doctor or health care professional. Do not take Tylenol (acetaminophen) or medicines that  have acetaminophen with this medicine. Too much acetaminophen can be very dangerous. Many nonprescription medicines contain acetaminophen. Always read the labels carefully to avoid taking more acetaminophen. What side effects may I notice from receiving this medicine? Side effects that you should report to your doctor or health care professional as soon as possible: -allergic reactions like skin rash, itching or hives, swelling of the face,  lips, or tongue -breathing difficulties, wheezing -confusion -light headedness or fainting spells -severe stomach pain -unusually weak or tired -yellowing of the skin or the whites of the eyes Side effects that usually do not require medical attention (report to your doctor or health care professional if they continue or are bothersome): -dizziness -drowsiness -nausea -vomiting This list may not describe all possible side effects. Call your doctor for medical advice about side effects. You may report side effects to FDA at 1-800-FDA-1088. Where should I keep my medicine? Keep out of the reach of children. This medicine can be abused. Keep your medicine in a safe place to protect it from theft. Do not share this medicine with anyone. Selling or giving away this medicine is dangerous and against the law. Store at room temperature between 20 and 25 degrees C (68 and 77 degrees F). Keep container tightly closed. Protect from light. This medicine may cause accidental overdose and death if it is taken by other adults, children, or pets. Flush any unused medicine down the toilet to reduce the chance of harm. Do not use the medicine after the expiration date. NOTE: This sheet is a summary. It may not cover all possible information. If you have questions about this medicine, talk to your doctor, pharmacist, or health care provider.  2015, [employer]vier/[employer] Standard. (2013-03-22 13:17:35)  Orphenadrine tablets What is this medicine? ORPHENADRINE (or FEN a dreen) helps to relieve pain and stiffness in muscles and can treat muscle spasms. This medicine may be used for other purposes; ask your health care provider or pharmacist if you have questions. COMMON BRAND NAME(S): Norflex What should I tell my health care provider before I take this medicine? They need to know if you have any of these conditions: -glaucoma -heart disease -kidney disease -myasthenia gravis -peptic ulcer disease -prostate  disease -stomach problems -an unusual or allergic reaction to orphenadrine, other medicines, foods, lactose, dyes, or preservatives -pregnant or trying to get pregnant -breast-feeding How should I use this medicine? Take this medicine by mouth with a full glass of water. Follow the directions on the prescription label. Take your medicine at regular intervals. Do not take your medicine more often than directed. Do not take more than you are told to take. Talk to your pediatrician regarding the use of this medicine in children. Special care may be needed. Patients over 70 years old may have a stronger reaction and need a smaller dose. Overdosage: If you think you have taken too much of this medicine contact a poison control center or emergency room at once. NOTE: This medicine is only for you. Do not share this medicine with others. What if I miss a dose? If you miss a dose, take it as soon as you can. If it is almost time for your next dose, take only that dose. Do not take double or extra doses. What may interact with this medicine? -alcohol -antihistamines -barbiturates, like phenobarbital -benzodiazepines -cyclobenzaprine -medicines for pain -phenothiazines like chlorpromazine, mesoridazine, prochlorperazine, thioridazine This list may not describe all possible interactions. Give your health care provider a list of all the medicines,  herbs, non-prescription drugs, or dietary supplements you use. Also tell them if you smoke, drink alcohol, or use illegal drugs. Some items may interact with your medicine. What should I watch for while using this medicine? Your mouth may get dry. Chewing sugarless gum or sucking hard candy, and drinking plenty of water may help. Contact your doctor if the problem does not go away or is severe. This medicine may cause dry eyes and blurred vision. If you wear contact lenses you may feel some discomfort. Lubricating drops may help. See your eye doctor if the  problem does not go away or is severe. You may get drowsy or dizzy. Do not drive, use machinery, or do anything that needs mental alertness until you know how this medicine affects you. Do not stand or sit up quickly, especially if you are an older patient. This reduces the risk of dizzy or fainting spells. Alcohol may interfere with the effect of this medicine. Avoid alcoholic drinks. What side effects may I notice from receiving this medicine? Side effects that you should report to your doctor or health care professional as soon as possible: -allergic reactions like skin rash, itching or hives, swelling of the face, lips, or tongue -changes in vision -difficulty breathing -fast heartbeat or palpitations -hallucinations -light headedness, fainting spells -vomiting Side effects that usually do not require medical attention (report to your doctor or health care professional if they continue or are bothersome): -dizziness -drowsiness -headache -nausea This list may not describe all possible side effects. Call your doctor for medical advice about side effects. You may report side effects to FDA at 1-800-FDA-1088. Where should I keep my medicine? Keep out of the reach of children. Store at room temperature between 15 and 30 degrees C (59 and 86 degrees F). Protect from light. Keep container tightly closed. Throw away any unused medicine after the expiration date. NOTE: This sheet is a summary. It may not cover all possible information. If you have questions about this medicine, talk to your doctor, pharmacist, or health care provider.  2015, Elsevier/Gold Standard. (2008-02-23 17:19:12)

## 2014-07-21 ENCOUNTER — Encounter (HOSPITAL_COMMUNITY): Payer: Self-pay | Admitting: Cardiology

## 2015-04-11 ENCOUNTER — Other Ambulatory Visit: Payer: Self-pay | Admitting: *Deleted

## 2015-04-11 MED ORDER — NITROGLYCERIN 0.4 MG SL SUBL: 0.4000 mg | SUBLINGUAL_TABLET | SUBLINGUAL | Status: DC | PRN

## 2015-04-12 ENCOUNTER — Other Ambulatory Visit: Payer: Self-pay | Admitting: *Deleted

## 2015-04-12 MED ORDER — NITROGLYCERIN 0.4 MG SL SUBL: 0.4000 mg | SUBLINGUAL_TABLET | SUBLINGUAL | Status: DC | PRN

## 2015-05-17 ENCOUNTER — Ambulatory Visit (INDEPENDENT_AMBULATORY_CARE_PROVIDER_SITE_OTHER): Payer: Medicare Other | Admitting: Cardiology

## 2015-05-17 ENCOUNTER — Encounter: Payer: Self-pay | Admitting: Cardiology

## 2015-05-17 VITALS — BP 138/78 | HR 57 | Ht 69.0 in | Wt 166.0 lb

## 2015-05-17 DIAGNOSIS — I2510 Atherosclerotic heart disease of native coronary artery without angina pectoris: Secondary | ICD-10-CM

## 2015-05-17 NOTE — Patient Instructions (Signed)
Medication Instructions:  The current medical regimen is effective;  continue present plan and medications.  Follow-Up: Follow up in 1 year with Dr. Hochrein.  You will receive a letter in the mail 2 months before you are due.  Please call us when you receive this letter to schedule your follow up appointment.  Thank you for choosing [facility]!!      

## 2015-05-17 NOTE — Progress Notes (Signed)
HPI The patient presents for followup after PCI and bare-metal stenting of the LAD and circumflex in Sept 2013. In Sept of 2014 he presented with chest discomfort and was subsequently found to have disease requiring bare-metal stenting to the circumflex and in the LAD.   He returns for yearly follow-up and has done relatively well. He has been diagnosed with prostate cancer and he's not sure how to going to treat it. He says they don't want to do surgery because of his previous heart disease.  He's not getting any radiation or chemotherapy at this time. He is following up with oncologist. He denies any cardiovascular symptoms. He does a lot of dancing. With this he denies any chest pressure, neck or arm discomfort. He has had no palpitations, presyncope or syncope. He has had no shortness of breath, PND or orthopnea. He's had no weight gain or edema.  Allergies  Allergen Reactions  . Tylenol With Codeine #3  [Acetaminophen-Codeine] Nausea And Vomiting    Current Outpatient Prescriptions  Medication Sig Dispense Refill  . ALPRAZolam (XANAX) 1 MG tablet Take 1 mg by mouth 3 (three) times daily as needed for anxiety. For anxiety    . Cholecalciferol (VITAMIN D PO) Take 1 tablet by mouth daily.    Marland Kitchen HYDROcodone-acetaminophen (VICODIN) 5-500 MG per tablet Take 1 tablet by mouth every 6 (six) hours as needed for pain.     . metoprolol succinate (TOPROL-XL) 25 MG 24 hr tablet Take 25 mg by mouth 2 (two) times daily.     . nitroGLYCERIN (NITROSTAT) 0.4 MG SL tablet Place 1 tablet (0.4 mg total) under the tongue every 5 (five) minutes x 3 doses as needed for chest pain. NEED OV. 25 tablet 0  . omeprazole (PRILOSEC) 20 MG capsule Take 20 mg by mouth daily.    . temazepam (RESTORIL) 15 MG capsule Take 15 mg by mouth at bedtime as needed for sleep.    . vitamin B-12 (CYANOCOBALAMIN) 1000 MCG tablet Take 1,000 mcg by mouth daily.    Marland Kitchen warfarin (COUMADIN) 5 MG tablet Take 5 mg by mouth daily.      No  current facility-administered medications for this visit.    Past Medical History  Diagnosis Date  . 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 GERD (gastroesophageal reflux disease)   . Anxiety   . Cancer ([place])     a. 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 CAD (coronary artery disease)     a. 05/2012 Acute Inf/Post MI, Cath/PCI: LM nl, LAD 50p, 28m, 70d, LCX nl, OM1 95ost, OM2 nl, RCA 100ost (2.75x20 Veriflex BMS), PDA 40p, EF 50%.  Residual dzs medically managed.;  b.05/2012 Echo: EF 60-65%, mild LVH;  c. 04/2013 Cath/PCI: LM nl, LAD 30p, 51m(3.0x16 Rebel BMS), 50-44m/d, LCX nl, OM1 90p(2.5x12 Rebel BMS), RCA 20p ISR, EF 55-60.  Marland Kitchen Smokeless tobacco use     a. chew  . Hypertension     Past Surgical History  Procedure Laterality Date  . Head and neck surgery    . Cholecystectomy    . Kidney stone surgery    . Inguinal hernia repair      x 2  . Left heart cath Right 05/12/2012    Procedure: LEFT HEART CATH;  Surgeon: Hillary Bow, MD;  Location: [facility] CATH LAB;  Service: Cardiovascular;  Laterality: Right;  . Percutaneous coronary stent intervention (pci-s) Right 05/12/2012  Procedure: PERCUTANEOUS CORONARY STENT INTERVENTION (PCI-S);  Surgeon: Hillary Bow, MD;  Location: [facility] CATH LAB;  Service: Cardiovascular;  Laterality: Right;  . Left heart catheterization with coronary angiogram N/A 04/13/2013    Procedure: LEFT HEART CATHETERIZATION WITH CORONARY ANGIOGRAM;  Surgeon: Peter M Martinique, MD;  Location: [facility] CATH LAB;  Service: Cardiovascular;  Laterality: N/A;  . Percutaneous coronary stent intervention (pci-s)  04/13/2013    Procedure: PERCUTANEOUS CORONARY STENT INTERVENTION (PCI-S);  Surgeon: Peter M Martinique, MD;  Location: [facility] CATH LAB;  Service: Cardiovascular;;  BMS  Mid LAD and  prox OM1    ROS:  As stated in the HPI and negative for all other systems.  PHYSICAL EXAM BP 138/78 mmHg  Pulse 57  Ht 5\' 9"  (1.753 m)  Wt 166 lb  (75.297 kg)  BMI 24.50 kg/m2 GENERAL:  Well appearing HEENT:  Pupils equal round and reactive, fundi not visualized, oral mucosa unremarkable, edentulous, status post surgical resection of an oral cancer NECK:  No jugular venous distention, waveform within normal limits, carotid upstroke brisk and symmetric, no bruits, no thyromegaly LUNGS:  Clear to auscultation bilaterally CHEST:  Unremarkable HEART:  PMI not displaced or sustained,S1 and S2 within normal limits, no S3, no S4, no clicks, no rubs, no murmurs ABD:  Flat, positive bowel sounds normal in frequency in pitch, no bruits, no rebound, no guarding, no midline pulsatile mass, no hepatomegaly, no splenomegaly EXT:  2 plus pulses upper and right radial, no edema, no cyanosis no clubbing, mild bruising in the right thigh,  absent left radial where he has had surgery   EKG:  Sinus rhythm, rate 57, axis within normal limits, right bundle branch block, no acute ST-T wave changes.  05/17/2015  ASSESSMENT AND PLAN  CAD:  The patient has no new sypmtoms.  No further cardiovascular testing is indicated.  We will continue with aggressive risk reduction and meds as listed.  WARFARIN THERAPY:  This is followed by TAPPER,DAVID B, MD.    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  TOBACCO USE:  I tell him every time to stop chewing.   RISK REDUCTION:  He has not wanted to take a statin and will see a markedly elevated LDL and it wasn't last year. Therefore, I will defer further attempts at starting statin therapy.Marland Kitchen

## 2015-10-12 ENCOUNTER — Ambulatory Visit (INDEPENDENT_AMBULATORY_CARE_PROVIDER_SITE_OTHER): Payer: Medicare Other | Admitting: Otolaryngology

## 2015-10-12 DIAGNOSIS — R040 Epistaxis: Secondary | ICD-10-CM

## 2015-12-13 ENCOUNTER — Encounter: Payer: Self-pay | Admitting: Cardiology

## 2015-12-13 ENCOUNTER — Ambulatory Visit (INDEPENDENT_AMBULATORY_CARE_PROVIDER_SITE_OTHER): Payer: Medicare Other | Admitting: Cardiology

## 2015-12-13 VITALS — BP 152/82 | HR 52 | Ht 69.0 in | Wt 172.0 lb

## 2015-12-13 DIAGNOSIS — R0789 Other chest pain: Secondary | ICD-10-CM

## 2015-12-13 DIAGNOSIS — R0602 Shortness of breath: Secondary | ICD-10-CM | POA: Diagnosis not present

## 2015-12-13 NOTE — Patient Instructions (Signed)
Medication Instructions:  The current medical regimen is effective;  continue present plan and medications.  Testing/Procedures: Your physician has requested that you have a lexiscan myoview. For further information please visit [email or web address]. Please follow instruction sheet, as given.  Follow-Up: Follow up in 6 months with Dr. Percival Spanish.  You will receive a letter in the mail 2 months before you are due.  Please call us when you receive this letter to schedule your follow up appointment.  If you need a refill on your cardiac medications before your next appointment, please call your pharmacy.  Thank you for choosing [facility]!!

## 2015-12-13 NOTE — Progress Notes (Signed)
HPI The patient presents for followup after PCI and bare-metal stenting of the LAD and circumflex in Sept 2013. In Sept of 2014 he presented with chest discomfort and was subsequently found to have disease requiring bare-metal stenting to the circumflex and in the LAD.   He returns for yearly follow-up.  Since I last saw him he did have apparently an episode of nose bleeding and had a somewhat high warfarin level. However, he's not had any other problems with this. He denies any ongoing bleeding issues. He has had some episodes of chest squeezing. This happened recently when he was walking behind his Roto tiller.  He's also had a little bit of shortness of breath and some leg weakness when he's trying to do his dancing.   He's not describing jaw or arm discomfort. He's not been having any weight gain or edema. He's not having any palpitations, presyncope or syncope. He's had no PND or orthopnea.   Allergies  Allergen Reactions  . Tylenol With Codeine #3  [Acetaminophen-Codeine] Nausea And Vomiting    Current Outpatient Prescriptions  Medication Sig Dispense Refill  . ALPRAZolam (XANAX) 1 MG tablet Take 1 mg by mouth 3 (three) times daily as needed for anxiety. For anxiety    . Cholecalciferol (VITAMIN D PO) Take 1 tablet by mouth daily.    Marland Kitchen HYDROcodone-acetaminophen (VICODIN) 5-500 MG per tablet Take 1 tablet by mouth every 6 (six) hours as needed for pain.     . metoprolol succinate (TOPROL-XL) 25 MG 24 hr tablet Take 25 mg by mouth 2 (two) times daily.     . nitroGLYCERIN (NITROSTAT) 0.4 MG SL tablet Place 1 tablet (0.4 mg total) under the tongue every 5 (five) minutes x 3 doses as needed for chest pain. NEED OV. 25 tablet 0  . omeprazole (PRILOSEC) 20 MG capsule Take 20 mg by mouth daily.    . temazepam (RESTORIL) 15 MG capsule Take 15 mg by mouth at bedtime as needed for sleep.    . vitamin B-12 (CYANOCOBALAMIN) 1000 MCG tablet Take 1,000 mcg by mouth daily.    Marland Kitchen warfarin (COUMADIN) 5 MG  tablet Take 5 mg by mouth daily.      No current facility-administered medications for this visit.    Past Medical History  Diagnosis Date  . 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 GERD (gastroesophageal reflux disease)   . Anxiety   . Cancer ([place])     a. 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 CAD (coronary artery disease)     a. 05/2012 Acute Inf/Post MI, Cath/PCI: LM nl, LAD 50p, 54m, 70d, LCX nl, OM1 95ost, OM2 nl, RCA 100ost (2.75x20 Veriflex BMS), PDA 40p, EF 50%.  Residual dzs medically managed.;  b.05/2012 Echo: EF 60-65%, mild LVH;  c. 04/2013 Cath/PCI: LM nl, LAD 30p, 21m(3.0x16 Rebel BMS), 50-36m/d, LCX nl, OM1 90p(2.5x12 Rebel BMS), RCA 20p ISR, EF 55-60.  Marland Kitchen Smokeless tobacco use     a. chew  . Hypertension   . Prostate cancer [facility])     Past Surgical History  Procedure Laterality Date  . Head and neck surgery    . Cholecystectomy    . Kidney stone surgery    . Inguinal hernia repair      x 2  . Left heart cath Right 05/12/2012    Procedure: LEFT HEART CATH;  Surgeon: Hillary Bow, MD;  Location: [facility]  CATH LAB;  Service: Cardiovascular;  Laterality: Right;  . Percutaneous coronary stent intervention (pci-s) Right 05/12/2012    Procedure: PERCUTANEOUS CORONARY STENT INTERVENTION (PCI-S);  Surgeon: Hillary Bow, MD;  Location: [facility] CATH LAB;  Service: Cardiovascular;  Laterality: Right;  . Left heart catheterization with coronary angiogram N/A 04/13/2013    Procedure: LEFT HEART CATHETERIZATION WITH CORONARY ANGIOGRAM;  Surgeon: Peter M Martinique, MD;  Location: [facility] CATH LAB;  Service: Cardiovascular;  Laterality: N/A;  . Percutaneous coronary stent intervention (pci-s)  04/13/2013    Procedure: PERCUTANEOUS CORONARY STENT INTERVENTION (PCI-S);  Surgeon: Peter M Martinique, MD;  Location: [facility] CATH LAB;  Service: Cardiovascular;;  BMS  Mid LAD and  prox OM1    ROS:  As stated in the HPI and negative for all other  systems.  PHYSICAL EXAM BP 152/82 mmHg  Pulse 52  Ht 5\' 9"  (1.753 m)  Wt 172 lb (78.019 kg)  BMI 25.39 kg/m2 GENERAL:  Well appearing HEENT:  Pupils equal round and reactive, fundi not visualized, oral mucosa unremarkable, edentulous, status post surgical resection of an oral cancer NECK:  No jugular venous distention, waveform within normal limits, carotid upstroke brisk and symmetric, no bruits, no thyromegaly LUNGS:  Clear to auscultation bilaterally CHEST:  Unremarkable HEART:  PMI not displaced or sustained,S1 and S2 within normal limits, no S3, no S4, no clicks, no rubs, no murmurs ABD:  Flat, positive bowel sounds normal in frequency in pitch, no bruits, no rebound, no guarding, no midline pulsatile mass, no hepatomegaly, no splenomegaly EXT:  2 plus pulses upper and right radial, no edema, no cyanosis no clubbing, mild bruising in the right thigh,  absent left radial where he has had surgery   EKG:  Sinus rhythm, rate 50, axis within normal limits, right bundle branch block, no acute ST-T wave changes.  12/13/2015  ASSESSMENT AND PLAN  CAD:   Given the symptoms that he has described I will bring him back for a Lexiscan Myoview.  HTN:  The blood pressure is mildly elevated recently she's been under stress worrying about his children. Typically his blood pressure is well controlled and he will remain on the meds as listed.  WARFARIN THERAPY:  This is followed by TAPPER,DAVID B, MD.    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  TOBACCO USE:  I tell him every time to stop chewing.   RISK REDUCTION:  He has not wanted to take a statin. Therefore, I will defer further attempts at starting statin therapy.Marland Kitchen

## 2015-12-15 ENCOUNTER — Encounter (HOSPITAL_COMMUNITY): Payer: Self-pay

## 2015-12-15 ENCOUNTER — Inpatient Hospital Stay (HOSPITAL_COMMUNITY): Admission: RE | Admit: 2015-12-15 | Payer: Medicare Other | Source: Ambulatory Visit

## 2015-12-15 ENCOUNTER — Encounter (HOSPITAL_COMMUNITY)
Admission: RE | Admit: 2015-12-15 | Discharge: 2015-12-15 | Disposition: A | Discharge status: Home / Self Care | Payer: Medicare Other | Source: Ambulatory Visit | Attending: Cardiology | Admitting: Cardiology

## 2015-12-15 DIAGNOSIS — R0602 Shortness of breath: Secondary | ICD-10-CM | POA: Diagnosis not present

## 2015-12-15 DIAGNOSIS — R0789 Other chest pain: Secondary | ICD-10-CM | POA: Insufficient documentation

## 2015-12-15 DIAGNOSIS — R931 Abnormal findings on diagnostic imaging of heart and coronary circulation: Secondary | ICD-10-CM | POA: Diagnosis not present

## 2015-12-15 MED ORDER — TECHNETIUM TC 99M SESTAMIBI GENERIC - CARDIOLITE
30.0000 | Freq: Once | INTRAVENOUS | Status: AC | PRN
  Administered 2015-12-15: 32 via INTRAVENOUS

## 2015-12-15 MED ORDER — REGADENOSON 0.4 MG/5ML IV SOLN
INTRAVENOUS | Status: AC
  Administered 2015-12-15: 0.4 mg via INTRAVENOUS
  Filled 2015-12-15: qty 5

## 2015-12-15 MED ORDER — SODIUM CHLORIDE 0.9% FLUSH
INTRAVENOUS | Status: AC
  Administered 2015-12-15: 10 mL via INTRAVENOUS
  Filled 2015-12-15: qty 10

## 2015-12-15 MED ORDER — TECHNETIUM TC 99M SESTAMIBI - CARDIOLITE
10.0000 | Freq: Once | INTRAVENOUS | Status: AC | PRN
  Administered 2015-12-15: 10:00:00 9.6 via INTRAVENOUS

## 2015-12-18 LAB — NM MYOCAR MULTI W/SPECT W/WALL MOTION / EF
CHL CUP NUCLEAR SDS: 2
CHL CUP NUCLEAR SRS: 5
CHL CUP NUCLEAR SSS: 7
CHL CUP RESTING HR STRESS: 60 {beats}/min
CSEPPHR: 77 {beats}/min
LHR: 0.3
LV dias vol: 86 mL (ref 62–150)
LV sys vol: 33 mL
TID: 0.96

## 2016-03-11 NOTE — Patient Instructions (Signed)
Your procedure is scheduled on: 03/18/2016   Report to [facility] at  750   AM.  Call this number if you have problems the morning of surgery: [phone]   Do not eat food or drink liquids :After Midnight.      Take these medicines the morning of surgery with A SIP OF WATER: xanax, vicodin, metoprolol, prilosec.   Do not wear jewelry, make-up or nail polish.  Do not wear lotions, powders, or perfumes. You may wear deodorant.  Do not shave 48 hours prior to surgery.  Do not bring valuables to the hospital.  Contacts, dentures or bridgework may not be worn into surgery.  Leave suitcase in the car. After surgery it may be brought to your room.  For patients admitted to the hospital, checkout time is 11:00 AM the day of discharge.   Patients discharged the day of surgery will not be allowed to drive home.  :     Please read over the following fact sheets that you were given: Coughing and Deep Breathing, Surgical Site Infection Prevention, Anesthesia Post-op Instructions and Care and Recovery After Surgery    Cataract A cataract is a clouding of the lens of the eye. When a lens becomes cloudy, vision is reduced based on the degree and nature of the clouding. Many cataracts reduce vision to some degree. Some cataracts make people more near-sighted as they develop. Other cataracts increase glare. Cataracts that are ignored and become worse can sometimes look white. The white color can be seen through the pupil. CAUSES   Aging. However, cataracts may occur at any age, even in newborns.   Certain drugs.   Trauma to the eye.   Certain diseases such as diabetes.   Specific eye diseases such as chronic inflammation inside the eye or a sudden attack of a rare form of glaucoma.   Inherited or acquired medical problems.  SYMPTOMS   Gradual, progressive drop in vision in the affected eye.   Severe, rapid visual loss. This most often happens when trauma is the cause.  DIAGNOSIS  To detect a  cataract, an eye doctor examines the lens. Cataracts are best diagnosed with an exam of the eyes with the pupils enlarged (dilated) by drops.  TREATMENT  For an early cataract, vision may improve by using different eyeglasses or stronger lighting. If that does not help your vision, surgery is the only effective treatment. A cataract needs to be surgically removed when vision loss interferes with your everyday activities, such as driving, reading, or watching TV. A cataract may also have to be removed if it prevents examination or treatment of another eye problem. Surgery removes the cloudy lens and usually replaces it with a substitute lens (intraocular lens, IOL).  At a time when both you and your doctor agree, the cataract will be surgically removed. If you have cataracts in both eyes, only one is usually removed at a time. This allows the operated eye to heal and be out of danger from any possible problems after surgery (such as infection or poor wound healing). In rare cases, a cataract may be doing damage to your eye. In these cases, your caregiver may advise surgical removal right away. The vast majority of people who have cataract surgery have better vision afterward. HOME CARE INSTRUCTIONS  If you are not planning surgery, you may be asked to do the following:  Use different eyeglasses.   Use stronger or brighter lighting.   Ask your eye doctor about  reducing your medicine dose or changing medicines if it is thought that a medicine caused your cataract. Changing medicines does not make the cataract go away on its own.   Become familiar with your surroundings. Poor vision can lead to injury. Avoid bumping into things on the affected side. You are at a higher risk for tripping or falling.   Exercise extreme care when driving or operating machinery.   Wear sunglasses if you are sensitive to bright light or experiencing problems with glare.  SEEK IMMEDIATE MEDICAL CARE IF:   You have a  worsening or sudden vision loss.   You notice redness, swelling, or increasing pain in the eye.   You have a fever.  Document Released: 07/29/2005 Document Revised: 07/18/2011 Document Reviewed: 03/22/2011 [facility] Patient Information 2012 [place].PATIENT INSTRUCTIONS POST-ANESTHESIA  IMMEDIATELY FOLLOWING SURGERY:  Do not drive or operate machinery for the first twenty four hours after surgery.  Do not make any important decisions for twenty four hours after surgery or while taking narcotic pain medications or sedatives.  If you develop intractable nausea and vomiting or a severe headache please notify your doctor immediately.  FOLLOW-UP:  Please make an appointment with your surgeon as instructed. You do not need to follow up with anesthesia unless specifically instructed to do so.  WOUND CARE INSTRUCTIONS (if applicable):  Keep a dry clean dressing on the anesthesia/puncture wound site if there is drainage.  Once the wound has quit draining you may leave it open to air.  Generally you should leave the bandage intact for twenty four hours unless there is drainage.  If the epidural site drains for more than 36-48 hours please call the anesthesia department.  QUESTIONS?:  Please feel free to call your physician or the hospital operator if you have any questions, and they will be happy to assist you.

## 2016-03-12 ENCOUNTER — Encounter (HOSPITAL_COMMUNITY)
Admission: RE | Admit: 2016-03-12 | Discharge: 2016-03-12 | Disposition: A | Discharge status: Home / Self Care | Payer: Medicare Other | Source: Ambulatory Visit | Attending: Ophthalmology | Admitting: Ophthalmology

## 2016-03-12 ENCOUNTER — Encounter (HOSPITAL_COMMUNITY): Payer: Self-pay

## 2016-03-12 DIAGNOSIS — Z01812 Encounter for preprocedural laboratory examination: Secondary | ICD-10-CM | POA: Insufficient documentation

## 2016-03-12 DIAGNOSIS — Z7901 Long term (current) use of anticoagulants: Secondary | ICD-10-CM | POA: Insufficient documentation

## 2016-03-12 DIAGNOSIS — Z86711 Personal history of pulmonary embolism: Secondary | ICD-10-CM | POA: Diagnosis not present

## 2016-03-12 LAB — CBC WITH DIFFERENTIAL/PLATELET
BASOS ABS: 0 10*3/uL (ref 0.0–0.1)
BASOS PCT: 0 %
Eosinophils Absolute: 0.2 10*3/uL (ref 0.0–0.7)
Eosinophils Relative: 2 %
HEMATOCRIT: 42 % (ref 39.0–52.0)
Hemoglobin: 13.5 g/dL (ref 13.0–17.0)
Lymphocytes Relative: 23 %
Lymphs Abs: 1.6 10*3/uL (ref 0.7–4.0)
MCH: 29.5 pg (ref 26.0–34.0)
MCHC: 32.1 g/dL (ref 30.0–36.0)
MCV: 91.7 fL (ref 78.0–100.0)
Monocytes Absolute: 0.5 10*3/uL (ref 0.1–1.0)
Monocytes Relative: 7 %
Neutro Abs: 4.5 10*3/uL (ref 1.7–7.7)
Neutrophils Relative %: 68 %
Platelets: 196 10*3/uL (ref 150–400)
RBC: 4.58 MIL/uL (ref 4.22–5.81)
RDW: 15.6 % — AB (ref 11.5–15.5)
WBC: 6.7 10*3/uL (ref 4.0–10.5)

## 2016-03-12 LAB — BASIC METABOLIC PANEL
ANION GAP: 5 (ref 5–15)
BUN: 20 mg/dL (ref 6–20)
CO2: 24 mmol/L (ref 22–32)
Calcium: 8.9 mg/dL (ref 8.9–10.3)
Chloride: 106 mmol/L (ref 101–111)
Creatinine, Ser: 1.36 mg/dL — ABNORMAL HIGH (ref 0.61–1.24)
GFR calc non Af Amer: 49 mL/min — ABNORMAL LOW (ref >60)
GFR, EST AFRICAN AMERICAN: 57 mL/min — AB (ref >60)
Glucose, Bld: 148 mg/dL — ABNORMAL HIGH (ref 65–99)
Potassium: 4.2 mmol/L (ref 3.5–5.1)
SODIUM: 135 mmol/L (ref 135–145)

## 2016-03-18 ENCOUNTER — Encounter (HOSPITAL_COMMUNITY)
Admission: RE | Disposition: A | Discharge status: Home / Self Care | Payer: Self-pay | Source: Ambulatory Visit | Attending: Ophthalmology

## 2016-03-18 ENCOUNTER — Ambulatory Visit (HOSPITAL_COMMUNITY)
Admission: RE | Admit: 2016-03-18 | Discharge: 2016-03-18 | Disposition: A | Discharge status: Home / Self Care | Payer: Medicare Other | Source: Ambulatory Visit | Attending: Ophthalmology | Admitting: Ophthalmology

## 2016-03-18 ENCOUNTER — Ambulatory Visit (HOSPITAL_COMMUNITY): Payer: Medicare Other | Admitting: Anesthesiology

## 2016-03-18 ENCOUNTER — Encounter (HOSPITAL_COMMUNITY): Payer: Self-pay | Admitting: Anesthesiology

## 2016-03-18 DIAGNOSIS — I10 Essential (primary) hypertension: Secondary | ICD-10-CM | POA: Insufficient documentation

## 2016-03-18 DIAGNOSIS — F419 Anxiety disorder, unspecified: Secondary | ICD-10-CM | POA: Diagnosis not present

## 2016-03-18 DIAGNOSIS — H2512 Age-related nuclear cataract, left eye: Secondary | ICD-10-CM | POA: Insufficient documentation

## 2016-03-18 DIAGNOSIS — Z955 Presence of coronary angioplasty implant and graft: Secondary | ICD-10-CM | POA: Insufficient documentation

## 2016-03-18 DIAGNOSIS — K219 Gastro-esophageal reflux disease without esophagitis: Secondary | ICD-10-CM | POA: Insufficient documentation

## 2016-03-18 DIAGNOSIS — I2510 Atherosclerotic heart disease of native coronary artery without angina pectoris: Secondary | ICD-10-CM | POA: Insufficient documentation

## 2016-03-18 HISTORY — PX: CATARACT EXTRACTION W/PHACO: SHX586

## 2016-03-18 SURGERY — PHACOEMULSIFICATION, CATARACT, WITH IOL INSERTION
Anesthesia: Monitor Anesthesia Care | Site: Eye | Laterality: Left

## 2016-03-18 MED ORDER — PHENYLEPHRINE-KETOROLAC 1-0.3 % IO SOLN
INTRAOCULAR | Status: DC | PRN
  Administered 2016-03-18: 500 mL via OPHTHALMIC

## 2016-03-18 MED ORDER — FENTANYL CITRATE (PF) 100 MCG/2ML IJ SOLN
25.0000 ug | INTRAMUSCULAR | Status: DC | PRN
  Administered 2016-03-18: 25 ug via INTRAVENOUS

## 2016-03-18 MED ORDER — BSS IO SOLN
INTRAOCULAR | Status: DC | PRN
  Administered 2016-03-18: 15 mL via INTRAOCULAR

## 2016-03-18 MED ORDER — POVIDONE-IODINE 5 % OP SOLN
OPHTHALMIC | Status: DC | PRN
  Administered 2016-03-18: 1 via OPHTHALMIC

## 2016-03-18 MED ORDER — MIDAZOLAM HCL 2 MG/2ML IJ SOLN
1.0000 mg | INTRAMUSCULAR | Status: DC | PRN
Start: 2016-03-18 — End: 2016-03-18
  Administered 2016-03-18: 1 mg via INTRAVENOUS

## 2016-03-18 MED ORDER — CYCLOPENTOLATE-PHENYLEPHRINE 0.2-1 % OP SOLN
1.0000 [drp] | OPHTHALMIC | Status: AC
  Administered 2016-03-18 (×3): 1 [drp] via OPHTHALMIC

## 2016-03-18 MED ORDER — TETRACAINE HCL 0.5 % OP SOLN
1.0000 [drp] | OPHTHALMIC | Status: AC
  Administered 2016-03-18 (×3): 1 [drp] via OPHTHALMIC

## 2016-03-18 MED ORDER — FENTANYL CITRATE (PF) 100 MCG/2ML IJ SOLN
INTRAMUSCULAR | Status: AC
  Filled 2016-03-18: qty 2

## 2016-03-18 MED ORDER — MIDAZOLAM HCL 2 MG/2ML IJ SOLN
INTRAMUSCULAR | Status: AC
  Filled 2016-03-18: qty 2

## 2016-03-18 MED ORDER — PHENYLEPHRINE-KETOROLAC 1-0.3 % IO SOLN
INTRAOCULAR | Status: AC
  Filled 2016-03-18: qty 4

## 2016-03-18 MED ORDER — LIDOCAINE HCL 3.5 % OP GEL
OPHTHALMIC | Status: DC | PRN
  Administered 2016-03-18: 1 via OPHTHALMIC

## 2016-03-18 MED ORDER — LACTATED RINGERS IV SOLN
INTRAVENOUS | Status: DC
  Administered 2016-03-18: 08:00:00 via INTRAVENOUS

## 2016-03-18 MED ORDER — LIDOCAINE HCL 3.5 % OP GEL: 1.0000 "application " | Freq: Once | OPHTHALMIC | Status: DC

## 2016-03-18 MED ORDER — TETRACAINE 0.5 % OP SOLN OPTIME - NO CHARGE
OPHTHALMIC | Status: DC | PRN
  Administered 2016-03-18: 2 [drp] via OPHTHALMIC

## 2016-03-18 MED ORDER — LIDOCAINE HCL (PF) 1 % IJ SOLN
INTRAMUSCULAR | Status: AC
  Filled 2016-03-18: qty 2

## 2016-03-18 MED ORDER — NA HYALUR & NA CHOND-NA HYALUR 0.55-0.5 ML IO KIT
PACK | INTRAOCULAR | Status: DC | PRN
  Administered 2016-03-18: 1 via OPHTHALMIC

## 2016-03-18 MED ORDER — CYCLOPENTOLATE-PHENYLEPHRINE 0.2-1 % OP SOLN
OPHTHALMIC | Status: AC
  Filled 2016-03-18: qty 2

## 2016-03-18 SURGICAL SUPPLY — 22 items
CAPSULAR TENSION RING-AMO (OPHTHALMIC RELATED) IMPLANT
CLOTH BEACON ORANGE TIMEOUT ST (SAFETY) ×3 IMPLANT
GLOVE BIOGEL PI IND STRL 6.5 (GLOVE) ×1 IMPLANT
GLOVE BIOGEL PI IND STRL 7.0 (GLOVE) IMPLANT
GLOVE BIOGEL PI INDICATOR 6.5 (GLOVE) ×2
GLOVE BIOGEL PI INDICATOR 7.0 (GLOVE)
GLOVE EXAM NITRILE LRG STRL (GLOVE) IMPLANT
GLOVE EXAM NITRILE MD LF STRL (GLOVE) ×3 IMPLANT
GLOVE SKINSENSE NS SZ6.5 (GLOVE)
GLOVE SKINSENSE STRL SZ6.5 (GLOVE) IMPLANT
INST SET CATARACT ~~LOC~~ (KITS) ×3 IMPLANT
KIT VITRECTOMY (OPHTHALMIC RELATED) IMPLANT
LENS ALC ACRYL/TECN (Ophthalmic Related) ×3 IMPLANT
PAD ARMBOARD 7.5X6 YLW CONV (MISCELLANEOUS) ×3 IMPLANT
PROC W NO LENS (INTRAOCULAR LENS)
PROC W SPEC LENS (INTRAOCULAR LENS)
PROCESS W NO LENS (INTRAOCULAR LENS) IMPLANT
PROCESS W SPEC LENS (INTRAOCULAR LENS) IMPLANT
RETRACTOR IRIS SIGHTPATH (OPHTHALMIC RELATED) IMPLANT
RING MALYGIN (MISCELLANEOUS) IMPLANT
VISCOELASTIC ADDITIONAL (OPHTHALMIC RELATED) IMPLANT
WATER STERILE IRR 250ML POUR (IV SOLUTION) ×3 IMPLANT

## 2016-03-18 NOTE — Anesthesia Preprocedure Evaluation (Signed)
Anesthesia Evaluation  Patient identified by MRN, date of birth, ID band Patient awake    Reviewed: Allergy & Precautions, NPO status , Patient's Chart, lab work & pertinent test results  Airway Mallampati: III  TM Distance: >3 FB Neck ROM: Full    Dental  (+) Edentulous Upper, Edentulous Lower   Pulmonary neg pulmonary ROS,    breath sounds clear to auscultation       Cardiovascular hypertension, + angina + CAD and + Cardiac Stents   Rhythm:Regular Rate:Normal     Neuro/Psych PSYCHIATRIC DISORDERS Anxiety    GI/Hepatic GERD  Controlled and Medicated,  Endo/Other    Renal/GU      Musculoskeletal   Abdominal   Peds  Hematology   Anesthesia Other Findings Head - neck CA, s/p radiation  Reproductive/Obstetrics                             Anesthesia Physical Anesthesia Plan  ASA: III  Anesthesia Plan: MAC   Post-op Pain Management:    Induction: Intravenous  Airway Management Planned: Nasal Cannula  Additional Equipment:   Intra-op Plan:   Post-operative Plan:   Informed Consent: I have reviewed the patients History and Physical, chart, labs and discussed the procedure including the risks, benefits and alternatives for the proposed anesthesia with the patient or authorized representative who has indicated his/her understanding and acceptance.     Plan Discussed with:   Anesthesia Plan Comments:         Anesthesia Quick Evaluation

## 2016-03-18 NOTE — Op Note (Signed)
03/18/2016  10:16 AM  PATIENT:  Anthony Dodson  75 y.o. male  PRE-OPERATIVE DIAGNOSIS:  nuclear cataract left eye  POST-OPERATIVE DIAGNOSIS:  nuclear cataract left eye  PROCEDURE:  Procedure(s): CATARACT EXTRACTION PHACO AND INTRAOCULAR LENS PLACEMENT ; CDE:  11.45  SURGEON:  Surgeon(s): Williams Che, MD  ASSISTANTS:   Zoila Shutter, CST   ANESTHESIA STAFF: Anesthesiologist: Lerry Liner, MD CRNA: Vista Deck, CRNA  ANESTHESIA:   topical and MAC  REQUESTED LENS POWER: 19.0  LENS IMPLANT INFORMATION:  Alcon [id number]  S/n [id number]  Exp 11/2019  CUMULATIVE DISSIPATED ENERGY:11.45  INDICATIONS:see scanned office H&P for detailed indications  OP FINDINGS:dense cortical and NS  COMPLICATIONS:None  PROCEDURE:  The patient was brought to the operating room in good condition.  The operative eye was prepped and draped in the usual fashion for intraocular surgery.  Lidocaine gel was dropped onto the eye.  A 2.4 mm 10 O'clock near clear corneal stepped incision and a 12 O'clock stab incision were created.  Viscoat was instilled into the anterior chamber.  The 5 mm anterior capsulorhexis was performed with a bent needle cystotome and Utrata forceps.  The lens was hydrodissected and hydrodelineated with a cannula and balanced salt solution and rotated with a Kuglen hook.  Phacoemulsification was perfomed in the divide and conquer technique.  The remaining cortex was removed with I&A and the capsular surfaces polished as necessary.  Provisc was placed into the capsular bag and the lens inserted with the Alcon inserter.  The viscoelastic was removed with I&A and the lens "rocked" into position.  The wounds were hydrated and te anterior chamber was refilled with balanced salt solution.  The wounds were checked for leakage and rehydrated as necessary.  The lid speculum and drapes were removed and the patient was transported to short stay in good condition.  PATIENT DISPOSITION:  Short Stay

## 2016-03-18 NOTE — Transfer of Care (Signed)
Immediate Anesthesia Transfer of Care Note  Patient: Anthony Dodson  Procedure(s) Performed: Procedure(s) (LRB): CATARACT EXTRACTION PHACO AND INTRAOCULAR LENS PLACEMENT ; CDE:  11.45 (Left)  Patient Location: Shortstay  Anesthesia Type: MAC  Level of Consciousness: awake  Airway & Oxygen Therapy: Patient Spontanous Breathing   Post-op Assessment: Report given to PACU RN, Post -op Vital signs reviewed and stable and Patient moving all extremities  Post vital signs: Reviewed and stable  Complications: No apparent anesthesia complications

## 2016-03-18 NOTE — H&P (Signed)
I have reviewed the pre printed H&P, the patient was re-examined, and I have identified no significant interval changes in the patient's medical condition.  There is no change in the plan of care since the history and physical of record. 

## 2016-03-18 NOTE — Anesthesia Postprocedure Evaluation (Signed)
Anesthesia Post Note  Patient: Anthony Dodson  Procedure(s) Performed: Procedure(s) (LRB): Anesthesia Post-op Note  Patient: Anthony Dodson  Procedure(s) Performed: Procedure(s) (LRB): CATARACT EXTRACTION PHACO AND INTRAOCULAR LENS PLACEMENT ; CDE:  11.45 (Left)  Patient Location:  Short Stay  Anesthesia Type: MAC  Level of Consciousness: awake  Airway and Oxygen Therapy: Patient Spontanous Breathing  Post-op Pain: none  Post-op Assessment: Post-op Vital signs reviewed, Patient's Cardiovascular Status Stable, Respiratory Function Stable, Patent Airway, No signs of Nausea or vomiting and Pain level controlled  Post-op Vital Signs: Reviewed and stable  Complications: No apparent anesthesia complications CATARACT EXTRACTION PHACO AND INTRAOCULAR LENS PLACEMENT ; CDE:  11.45 (Left)  Anesthesia Post Evaluation   Last Pain:  Vitals:   03/18/16 0805  TempSrc: Oral                 Kathern Lobosco

## 2016-03-18 NOTE — Anesthesia Procedure Notes (Signed)
Procedure Name: MAC Date/Time: 03/18/2016 8:42 AM Performed by: Vista Deck Pre-anesthesia Checklist: Patient identified, Emergency Drugs available, Suction available, Timeout performed and Patient being monitored Patient Re-evaluated:Patient Re-evaluated prior to inductionOxygen Delivery Method: Nasal Cannula

## 2016-03-18 NOTE — Brief Op Note (Signed)
03/18/2016  10:16 AM  PATIENT:  Anthony Dodson  75 y.o. male  PRE-OPERATIVE DIAGNOSIS:  nuclear cataract left eye  POST-OPERATIVE DIAGNOSIS:  nuclear cataract left eye  PROCEDURE:  Procedure(s): CATARACT EXTRACTION PHACO AND INTRAOCULAR LENS PLACEMENT ; CDE:  11.45  SURGEON:  Surgeon(s): Williams Che, MD  ASSISTANTS:   Zoila Shutter, CST   ANESTHESIA STAFF: Anesthesiologist: Lerry Liner, MD CRNA: Vista Deck, CRNA  ANESTHESIA:   topical and MAC  REQUESTED LENS POWER: 19.0  LENS IMPLANT INFORMATION:  Alcon [id number]  S/n [id number]  Exp 11/2019  CUMULATIVE DISSIPATED ENERGY:11.45  INDICATIONS:see scanned office H&P for detailed indications  OP FINDINGS:dense cortical and NS  COMPLICATIONS:None  DICTATION #: none  PLAN OF CARE: as above  PATIENT DISPOSITION:  Short Stay

## 2016-03-21 ENCOUNTER — Encounter (HOSPITAL_COMMUNITY): Payer: Self-pay | Admitting: Ophthalmology

## 2016-03-27 ENCOUNTER — Encounter (HOSPITAL_COMMUNITY): Payer: Self-pay | Admitting: Ophthalmology

## 2016-05-20 ENCOUNTER — Telehealth: Payer: Self-pay | Admitting: Cardiology

## 2016-05-20 NOTE — Telephone Encounter (Signed)
[name] is calling because Anthony Dodson had some chest pains last evening and had to take a Nitro . He is schedule to see Dr. Percival Spanish in the [place] office on 06/07/16 . Please call

## 2016-05-20 NOTE — Telephone Encounter (Signed)
Returned call. Patient expresses that his wife called, but he would talk to me concerning his symptoms. States he had "a little chest tightness", a little shortness of breath yesterday, which resolved. He took 1 nitro to address and it got better. No symptoms today. Denies exertional onset, denies fatigue, dizziness, radiating jaw or neck pain. Denies syncope. He voiced acknowledgment of my instruction to go to ED if chest pain does not resolve after 2 doses of NTG. He voiced some impatience on the phone and preferred to end call. Patient stated he is OK to follow up as scheduled w Dr. Percival Spanish and will call or go to ED if he has new concerns.

## 2016-05-28 ENCOUNTER — Encounter (HOSPITAL_COMMUNITY): Payer: Self-pay | Admitting: Emergency Medicine

## 2016-05-28 ENCOUNTER — Emergency Department (HOSPITAL_COMMUNITY)
Admission: EM | Admit: 2016-05-28 | Discharge: 2016-05-29 | Disposition: A | Discharge status: Home / Self Care | Payer: Medicare Other | Attending: Emergency Medicine | Admitting: Emergency Medicine

## 2016-05-28 ENCOUNTER — Emergency Department (HOSPITAL_COMMUNITY): Payer: Medicare Other

## 2016-05-28 DIAGNOSIS — Z7901 Long term (current) use of anticoagulants: Secondary | ICD-10-CM | POA: Diagnosis not present

## 2016-05-28 DIAGNOSIS — M791 Myalgia: Secondary | ICD-10-CM | POA: Diagnosis not present

## 2016-05-28 DIAGNOSIS — Z79899 Other long term (current) drug therapy: Secondary | ICD-10-CM | POA: Diagnosis not present

## 2016-05-28 DIAGNOSIS — I10 Essential (primary) hypertension: Secondary | ICD-10-CM | POA: Diagnosis not present

## 2016-05-28 DIAGNOSIS — I2510 Atherosclerotic heart disease of native coronary artery without angina pectoris: Secondary | ICD-10-CM | POA: Diagnosis not present

## 2016-05-28 DIAGNOSIS — K5900 Constipation, unspecified: Secondary | ICD-10-CM | POA: Diagnosis not present

## 2016-05-28 DIAGNOSIS — R079 Chest pain, unspecified: Secondary | ICD-10-CM | POA: Insufficient documentation

## 2016-05-28 NOTE — ED Provider Notes (Signed)
[facility] DEPT Provider Note   CSN: [id number] Arrival date & time: 05/28/16  2108  By signing my name below, I, Jeanell Sparrow, attest that this documentation has been prepared under the direction and in the presence of Rolland Porter, MD. Electronically Signed: Jeanell Sparrow, Scribe. 05/28/2016. 11:38 PM.  Time seen 23:37 PM  History   Chief Complaint Chief Complaint  Patient presents with  . Constipation   The history is provided by the patient. No language interpreter was used.   HPI Comments: Anthony Dodson is a 75 y.o. male who presents to the Emergency Department complaining of constipation that started a few hours ago.He states about 5:30 PM he felt the need to go to the bathroom but was unable to. He then started having rectal pain. Pt states he last time he used the bathroom was 5 days ago. He usually has a BM every 2 days. He tried Miralax twice a day over the past few days and suppositories tonight without any relief. He reports having a similar compliant a year ago requiring overnight hospital admission at [facility] when he also was noted to have a UTI. Pt reports associated symptoms of decreased appetite, rectal pain, chest pain, and BLE pain (right worse than left) onset this evening. He states he had the leg pain a year ago with his constipation and UTI.  He states that he had intermittent left-sided chest pain onset 2 days ago that was resolved by Nitroglycerin. He describes the pain as located in the center of his chest,  an intermittent, sharp, and jabbing sensation that was relieved after he took NTG. 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 He states that he is currently on a blood thinner. Pt denies any recent pain medication use, recent diet changes, dysuria, urinary frequency, nausea, fever, abdominal pain, vomiting, chills, or other complaints.    PCP: Deloria Lair, MD  Past Medical History:  Diagnosis Date  . Anxiety   . CAD  (coronary artery disease)    a. 05/2012 Acute Inf/Post MI, Cath/PCI: LM nl, LAD 50p, 34m, 70d, LCX nl, OM1 95ost, OM2 nl, RCA 100ost (2.75x20 Veriflex BMS), PDA 40p, EF 50%.  Residual dzs medically managed.;  b.05/2012 Echo: EF 60-65%, mild LVH;  c. 04/2013 Cath/PCI: LM nl, LAD 30p, 56m(3.0x16 Rebel BMS), 50-26m/d, LCX nl, OM1 90p(2.5x12 Rebel BMS), RCA 20p ISR, EF 55-60.  Marland Kitchen Cancer ([place])    a. 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 GERD (gastroesophageal reflux disease)   . Hypertension   . Prostate cancer ([place])   . 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  Chronic Coumadin  . Smokeless tobacco use    a. chew    Patient Active Problem List   Diagnosis Date Noted  . Groin hematoma 04/15/2013  . Unstable angina ([place]) 04/14/2013  . Hyperlipidemia 04/14/2013  . Hypertension   . Acute coronary syndrome ([place]) 04/10/2013  . CAD (coronary artery disease) 05/19/2012  . 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 History of head or neck cancer 05/13/2012    Past Surgical History:  Procedure Laterality Date  . CATARACT EXTRACTION W/PHACO Left 03/18/2016   Procedure: CATARACT EXTRACTION PHACO AND INTRAOCULAR LENS PLACEMENT ; CDE:  11.45;  Surgeon: Williams Che, MD;  Location: [facility];  Service: Ophthalmology;  Laterality: Left;  . CHOLECYSTECTOMY    . Head and neck surgery    . INGUINAL HERNIA  REPAIR     x 2  . KIDNEY STONE SURGERY    . LEFT HEART CATH Right 05/12/2012   Procedure: LEFT HEART CATH;  Surgeon: Hillary Bow, MD;  Location: [facility] CATH LAB;  Service: Cardiovascular;  Laterality: Right;  . LEFT HEART CATHETERIZATION WITH CORONARY ANGIOGRAM N/A 04/13/2013   Procedure: LEFT HEART CATHETERIZATION WITH CORONARY ANGIOGRAM;  Surgeon: Peter M Martinique, MD;  Location: [facility] CATH LAB;  Service: Cardiovascular;  Laterality: N/A;  . PERCUTANEOUS CORONARY STENT INTERVENTION (PCI-S) Right 05/12/2012   Procedure: PERCUTANEOUS CORONARY STENT INTERVENTION (PCI-S);   Surgeon: Hillary Bow, MD;  Location: [facility] CATH LAB;  Service: Cardiovascular;  Laterality: Right;  . PERCUTANEOUS CORONARY STENT INTERVENTION (PCI-S)  04/13/2013   Procedure: PERCUTANEOUS CORONARY STENT INTERVENTION (PCI-S);  Surgeon: Peter M Martinique, MD;  Location: [facility] CATH LAB;  Service: Cardiovascular;;  BMS  Mid LAD and  prox OM1       Home Medications    Prior to Admission medications   Medication Sig Start Date End Date Taking? Authorizing Provider  ALPRAZolam Duanne Moron) 1 MG tablet Take 1 mg by mouth 3 (three) times daily as needed for anxiety. For anxiety    Historical Provider, MD  Cholecalciferol (VITAMIN D PO) Take 1 tablet by mouth daily.    Historical Provider, MD  HYDROcodone-acetaminophen (VICODIN) 5-500 MG per tablet Take 1 tablet by mouth every 6 (six) hours as needed for pain.  07/26/13   Historical Provider, MD  metoprolol succinate (TOPROL-XL) 25 MG 24 hr tablet Take 25 mg by mouth 2 (two) times daily.     Historical Provider, MD  nitroGLYCERIN (NITROSTAT) 0.4 MG SL tablet Place 1 tablet (0.4 mg total) under the tongue every 5 (five) minutes x 3 doses as needed for chest pain. NEED OV. 04/12/15   Minus Breeding, MD  omeprazole (PRILOSEC) 20 MG capsule Take 20 mg by mouth daily.    Historical Provider, MD  temazepam (RESTORIL) 15 MG capsule Take 15 mg by mouth at bedtime as needed for sleep.    Historical Provider, MD  vitamin B-12 (CYANOCOBALAMIN) 1000 MCG tablet Take 1,000 mcg by mouth daily.    Historical Provider, MD  warfarin (COUMADIN) 5 MG tablet Take 5 mg by mouth daily.     Historical Provider, MD    Family History Family History  Problem Relation Age of Onset  . Lung disease Father     died in his 44's of black lung  . Stroke Mother     died in her 57's.    Social History Social History  Substance Use Topics  . Smoking status: Never Smoker  . 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 Alcohol use No  lives at home Lives with spouse   Allergies   Tylenol with codeine #3  [acetaminophen-codeine]   Review of Systems Review of Systems  Constitutional: Positive for appetite change. Negative for chills.  Cardiovascular: Positive for chest pain.  Gastrointestinal: Positive for constipation and rectal pain. Negative for abdominal pain, nausea and vomiting.  Genitourinary: Negative for dysuria and frequency.  Musculoskeletal: Positive for myalgias (BLE).  Hematological: Bruises/bleeds easily.  All other systems reviewed and are negative.    Physical Exam Updated Vital Signs BP (!) 158/103 (BP Location: Right Arm)   Pulse 87   Temp 97.9 F (36.6 C) (Oral)   Resp 18  Ht 5' 8.6" (1.742 m)   Wt 162 lb (73.5 kg)   SpO2 100%   BMI 24.20 kg/m   Vital signs normal except for hypertension  Physical Exam  Constitutional: He is oriented to person, place, and time. He appears well-developed and well-nourished.  Non-toxic appearance. He does not appear ill. No distress.  HENT:  Head: Normocephalic and atraumatic.  Right Ear: External ear normal.  Left Ear: External ear normal.  Nose: Nose normal. No mucosal edema or rhinorrhea.  Mouth/Throat: Oropharynx is clear and moist and mucous membranes are normal. No dental abscesses or uvula swelling. No oropharyngeal exudate.  Eyes: Conjunctivae and EOM are normal. Pupils are equal, round, and reactive to light.  Neck: Normal range of motion and full passive range of motion without pain. Neck supple.  Cardiovascular: Normal rate, regular rhythm and normal heart sounds.  Exam reveals no gallop and no friction rub.   No murmur heard. Pulmonary/Chest: Effort normal and breath sounds normal. No respiratory distress. He has no wheezes. He has no rhonchi. He has no rales. He exhibits no tenderness and no crepitus.  Abdominal: Soft. Normal appearance and bowel sounds are normal. He exhibits no  distension. There is no tenderness. There is no rebound and no guarding.    TTP to suprapubic and left lower quadrant  Musculoskeletal: Normal range of motion. He exhibits no edema or tenderness.  Moves all extremities well.   Neurological: He is alert and oriented to person, place, and time. He has normal strength. No cranial nerve deficit.  Skin: Skin is warm, dry and intact. No rash noted. No erythema. No pallor.  Psychiatric: He has a normal mood and affect. His speech is normal and behavior is normal. His mood appears not anxious.  Nursing note and vitals reviewed.    ED Treatments / Results  DIAGNOSTIC STUDIES: Oxygen Saturation is 100% on RA, normal by my interpretation.    Labs (all labs ordered are listed, but only abnormal results are displayed) Results for orders placed or performed during the hospital encounter of 05/28/16  Troponin I  Result Value Ref Range   Troponin I <0.03 <0.03 ng/mL  Urinalysis, Routine w reflex microscopic  Result Value Ref Range   Color, Urine STRAW (A) YELLOW   APPearance CLEAR CLEAR   Specific Gravity, Urine <1.005 (L) 1.005 - 1.030   pH 5.5 5.0 - 8.0   Glucose, UA NEGATIVE NEGATIVE mg/dL   Hgb urine dipstick NEGATIVE NEGATIVE   Bilirubin Urine NEGATIVE NEGATIVE   Ketones, ur NEGATIVE NEGATIVE mg/dL   Protein, ur NEGATIVE NEGATIVE mg/dL   Nitrite NEGATIVE NEGATIVE   Leukocytes, UA NEGATIVE NEGATIVE  Basic metabolic panel  Result Value Ref Range   Sodium 136 135 - 145 mmol/L   Potassium 4.1 3.5 - 5.1 mmol/L   Chloride 102 101 - 111 mmol/L   CO2 26 22 - 32 mmol/L   Glucose, Bld 92 65 - 99 mg/dL   BUN 19 6 - 20 mg/dL   Creatinine, Ser 1.56 (H) 0.61 - 1.24 mg/dL   Calcium 9.3 8.9 - 10.3 mg/dL   GFR calc non Af Amer 41 (L) >60 mL/min   GFR calc Af Amer 48 (L) >60 mL/min   Anion gap 8 5 - 15  CBC with Differential  Result Value Ref Range   WBC 7.6 4.0 - 10.5 K/uL   RBC 4.76 4.22 - 5.81 MIL/uL   Hemoglobin 14.2 13.0 - 17.0 g/dL    HCT 44.1 39.0 - 52.0 %  MCV 92.6 78.0 - 100.0 fL   MCH 29.8 26.0 - 34.0 pg   MCHC 32.2 30.0 - 36.0 g/dL   RDW 14.5 11.5 - 15.5 %   Platelets 209 150 - 400 K/uL   Neutrophils Relative % 60 %   Neutro Abs 4.5 1.7 - 7.7 K/uL   Lymphocytes Relative 26 %   Lymphs Abs 2.0 0.7 - 4.0 K/uL   Monocytes Relative 12 %   Monocytes Absolute 0.9 0.1 - 1.0 K/uL   Eosinophils Relative 2 %   Eosinophils Absolute 0.2 0.0 - 0.7 K/uL   Basophils Relative 0 %   Basophils Absolute 0.0 0.0 - 0.1 K/uL  Protime-INR  Result Value Ref Range   Prothrombin Time 18.8 (H) 11.4 - 15.2 seconds   INR 1.56    Laboratory interpretation all normal except Subtherapeutic INR, renal insufficiency    EKG  EKG Interpretation  Date/Time:  Wednesday May 29 2016 00:03:01 EDT Ventricular Rate:  76 PR Interval:    QRS Duration: 130 QT Interval:  392 QTC Calculation: 441 R Axis:   -60 Text Interpretation:  Sinus rhythm Right bundle branch block Inferior infarct, old No significant change since last tracing 14 Apr 2013 Confirmed by [facility]  MD-I, Cylie Dor ([id number]) on 05/29/2016 1:28:14 AM       Radiology Dg Abdomen 1 View  Result Date: 05/28/2016 CLINICAL DATA:  75 year old male with constipation and abdominal pain. EXAM: ABDOMEN - 1 VIEW COMPARISON:  CT dated 08/04/2015 FINDINGS: There is moderate stool throughout the colon. Dense stool noted in the rectosigmoid. No bowel dilatation or evidence of obstruction. No free air go to. A 6 mm amorphous radiopaque focus over the left renal silhouette may be related to stool content or represent a renal calculus. The osseous structures and soft tissues are otherwise unremarkable. IMPRESSION: Constipation with possible fecal impaction in the rectum. No bowel obstruction. Electronically Signed   By: Anner Crete M.D.   On: 05/28/2016 23:27    Procedures Procedures (including critical care time)  Medications Ordered in ED Medications  sodium phosphate (FLEET) 7-19 GM/118ML  enema 1 enema (1 enema Rectal Given 05/29/16 0051)  bisacodyl (DULCOLAX) suppository 10 mg (10 mg Rectal Given 05/29/16 0204)  naproxen (NAPROSYN) tablet 250 mg (250 mg Oral Given 05/29/16 0204)  magnesium citrate solution 1 Bottle (1 Bottle Oral Given 05/29/16 0414)     Initial Impression / Assessment and Plan / ED Course  I have reviewed the triage vital signs and the nursing notes.  Pertinent labs & imaging results that were available during my care of the patient were reviewed by me and considered in my medical decision making (see chart for details).  Clinical Course     COORDINATION OF CARE: 11:00 PM- Pt advised of plan for treatment and pt agrees.EKG and troponin was done due to his complaints of chest pain 2 days ago. He has a history of coronary artery disease.  After reviewing his test results specifically his EKG and troponin fleets enema was ordered.  12:45 AM discussed his test results. Pt agreeable to get a fleets enema.   01:30 AM pt in bathroom after his enema  Nurse reports he has had 2 episodes of BM. Still c/o leg pain, given naproxen (narcotics will make his constipation worse).   Recheck 02:30 AM pt states he had an initial BM after the enema, does cannot tell me if it was less than a regular BM or not. He now states they gave him a soap  suds enema at [facility], so that was ordered.   03:30 AM nurse reports she tried to disimpact patient but he just has a lot of soft stool. PT given magnesium citrate orally.   05:40 AM Pt states he had some BM, and is feeling better, rectal pain and leg pain are much better, wants to stay in ED a little longer (wife doesn't want to call someone to come pick them up this early).   07:00AM patient has gone to the bathroom again and had a lot of result. Feels much better.   Final Clinical Impressions(s) / ED Diagnoses   Final diagnoses:  Constipation, unspecified constipation type    New Prescriptions OTC miralax  Plan  discharge   Rolland Porter, MD, FACEP  I personally performed the services described in this documentation, which was scribed in my presence. The recorded information has been reviewed and considered.  Rolland Porter, MD, Barbette Or, MD 05/29/16 [phone]

## 2016-05-28 NOTE — ED Triage Notes (Signed)
Pt reports constipation and leg pain. Pt states he hasn't had a BM in 5-6 days. Pt states he tried using a suppository. Pt used Miralax the past couple of days.

## 2016-05-29 DIAGNOSIS — K5900 Constipation, unspecified: Secondary | ICD-10-CM | POA: Diagnosis not present

## 2016-05-29 LAB — PROTIME-INR
INR: 1.56
PROTHROMBIN TIME: 18.8 s — AB (ref 11.4–15.2)

## 2016-05-29 LAB — URINALYSIS, ROUTINE W REFLEX MICROSCOPIC
Bilirubin Urine: NEGATIVE
Glucose, UA: NEGATIVE mg/dL
Hgb urine dipstick: NEGATIVE
Ketones, ur: NEGATIVE mg/dL
LEUKOCYTES UA: NEGATIVE
NITRITE: NEGATIVE
PH: 5.5 (ref 5.0–8.0)
Protein, ur: NEGATIVE mg/dL

## 2016-05-29 LAB — CBC WITH DIFFERENTIAL/PLATELET
BASOS PCT: 0 %
Basophils Absolute: 0 10*3/uL (ref 0.0–0.1)
EOS ABS: 0.2 10*3/uL (ref 0.0–0.7)
Eosinophils Relative: 2 %
HEMATOCRIT: 44.1 % (ref 39.0–52.0)
Hemoglobin: 14.2 g/dL (ref 13.0–17.0)
LYMPHS ABS: 2 10*3/uL (ref 0.7–4.0)
Lymphocytes Relative: 26 %
MCH: 29.8 pg (ref 26.0–34.0)
MCHC: 32.2 g/dL (ref 30.0–36.0)
MCV: 92.6 fL (ref 78.0–100.0)
MONOS PCT: 12 %
Monocytes Absolute: 0.9 10*3/uL (ref 0.1–1.0)
NEUTROS ABS: 4.5 10*3/uL (ref 1.7–7.7)
Neutrophils Relative %: 60 %
Platelets: 209 10*3/uL (ref 150–400)
RBC: 4.76 MIL/uL (ref 4.22–5.81)
RDW: 14.5 % (ref 11.5–15.5)
WBC: 7.6 10*3/uL (ref 4.0–10.5)

## 2016-05-29 LAB — BASIC METABOLIC PANEL
ANION GAP: 8 (ref 5–15)
BUN: 19 mg/dL (ref 6–20)
CALCIUM: 9.3 mg/dL (ref 8.9–10.3)
CHLORIDE: 102 mmol/L (ref 101–111)
CO2: 26 mmol/L (ref 22–32)
CREATININE: 1.56 mg/dL — AB (ref 0.61–1.24)
GFR calc non Af Amer: 41 mL/min — ABNORMAL LOW (ref >60)
GFR, EST AFRICAN AMERICAN: 48 mL/min — AB (ref >60)
Glucose, Bld: 92 mg/dL (ref 65–99)
Potassium: 4.1 mmol/L (ref 3.5–5.1)
SODIUM: 136 mmol/L (ref 135–145)

## 2016-05-29 LAB — TROPONIN I: Troponin I: <0.03 ng/mL (ref <0.03)

## 2016-05-29 MED ORDER — NAPROXEN 250 MG PO TABS
250.0000 mg | ORAL_TABLET | Freq: Once | ORAL | Status: AC
  Administered 2016-05-29: 250 mg via ORAL

## 2016-05-29 MED ORDER — BISACODYL 10 MG RE SUPP
10.0000 mg | Freq: Once | RECTAL | Status: AC
  Administered 2016-05-29: 10 mg via RECTAL

## 2016-05-29 MED ORDER — FLEET ENEMA 7-19 GM/118ML RE ENEM
1.0000 | ENEMA | Freq: Once | RECTAL | Status: AC
  Administered 2016-05-29: 1 via RECTAL

## 2016-05-29 MED ORDER — BISACODYL 10 MG RE SUPP
RECTAL | Status: AC
  Administered 2016-05-29: 10 mg via RECTAL
  Filled 2016-05-29: qty 1

## 2016-05-29 MED ORDER — NAPROXEN 250 MG PO TABS
ORAL_TABLET | ORAL | Status: AC
  Administered 2016-05-29: 250 mg via ORAL
  Filled 2016-05-29: qty 1

## 2016-05-29 MED ORDER — MAGNESIUM CITRATE PO SOLN
1.0000 | Freq: Once | ORAL | Status: AC
  Administered 2016-05-29: 1 via ORAL
  Filled 2016-05-29: qty 296

## 2016-05-29 NOTE — ED Notes (Signed)
Patient remains in bathroom at this time.

## 2016-05-29 NOTE — Discharge Instructions (Signed)
Get miralax and put one dose or 17 g in 8 ounces of water,  take 1 dose every 30 minutes for 2-3 hours or until you  get good results and then once or twice daily to prevent constipation. You can try a enema again which are OTC if you feel you have a lump of hard stool in your rectum to help soften it so it will pass.   Recheck if you get a fever, abdominal pain, nausea or vomiting.

## 2016-05-29 NOTE — ED Notes (Signed)
MD aware that patient's spouse would like to talk to her.

## 2016-05-29 NOTE — ED Notes (Signed)
Patient's spouse at nurses station expressing concern over her husband, the length of time they have been here, and the fact that the patient is not getting what he had at [facility] one year ago. Patient's spouse was not polite and demanding that the MD come and talk with her immediately.

## 2016-05-29 NOTE — ED Notes (Signed)
Patient verbalized that he is unable to have a bowel movement.

## 2016-05-29 NOTE — ED Notes (Signed)
Patient states that he has been on the toilet for a while and that he had a significant bowel movement.  States that he feels like there is stool that needs to come out.  States that he sat on the toilet and strained for so long that his right leg is killing him.

## 2016-05-29 NOTE — ED Notes (Addendum)
Attempted to disimpact patient.  Stool soft, unable to remove any stool from rectal vault.  MD aware.

## 2016-06-04 ENCOUNTER — Other Ambulatory Visit: Payer: Self-pay | Admitting: Cardiology

## 2016-06-04 MED ORDER — NITROGLYCERIN 0.4 MG SL SUBL: 0.4000 mg | SUBLINGUAL_TABLET | SUBLINGUAL | 0 refills | Status: DC | PRN

## 2016-06-04 NOTE — Telephone Encounter (Signed)
Medication sent to pharmacy  

## 2016-06-04 NOTE — Telephone Encounter (Signed)
Refill:   nitroGLYCERIN (NITROSTAT) 0.4 MG SL tablet

## 2016-06-06 NOTE — Progress Notes (Deleted)
HPI The patient presents for followup after PCI and bare-metal stenting of the LAD and circumflex in Sept 2013. In Sept of 2014 he presented with chest discomfort and was subsequently found to have disease requiring bare-metal stenting to the circumflex and in the LAD.  He did call earlier this month and was having some chest discomfort.  ***    He returns for yearly follow-up.  Since I last saw him he did have apparently an episode of nose bleeding and had a somewhat high warfarin level. However, he's not had any other problems with this. He denies any ongoing bleeding issues. He has had some episodes of chest squeezing. This happened recently when he was walking behind his Roto tiller.  He's also had a little bit of shortness of breath and some leg weakness when he's trying to do his dancing.   He's not describing jaw or arm discomfort. He's not been having any weight gain or edema. He's not having any palpitations, presyncope or syncope. He's had no PND or orthopnea.   Allergies  Allergen Reactions  . Tylenol With Codeine #3  [Acetaminophen-Codeine] Nausea And Vomiting    Current Outpatient Prescriptions  Medication Sig Dispense Refill  . ALPRAZolam (XANAX) 1 MG tablet Take 1 mg by mouth 3 (three) times daily as needed for anxiety. For anxiety    . Cholecalciferol (VITAMIN D PO) Take 1 tablet by mouth daily.    Marland Kitchen HYDROcodone-acetaminophen (VICODIN) 5-500 MG per tablet Take 1 tablet by mouth every 6 (six) hours as needed for pain.     . metoprolol succinate (TOPROL-XL) 25 MG 24 hr tablet Take 25 mg by mouth 2 (two) times daily.     . nitroGLYCERIN (NITROSTAT) 0.4 MG SL tablet Place 1 tablet (0.4 mg total) under the tongue every 5 (five) minutes x 3 doses as needed for chest pain. NEED OV. 25 tablet 0  . omeprazole (PRILOSEC) 20 MG capsule Take 20 mg by mouth daily.    . temazepam (RESTORIL) 15 MG capsule Take 15 mg by mouth at bedtime as needed for sleep.    . vitamin B-12 (CYANOCOBALAMIN)  1000 MCG tablet Take 1,000 mcg by mouth daily.    Marland Kitchen warfarin (COUMADIN) 5 MG tablet Take 5 mg by mouth daily.      No current facility-administered medications for this visit.     Past Medical History:  Diagnosis Date  . Anxiety   . CAD (coronary artery disease)    a. 05/2012 Acute Inf/Post MI, Cath/PCI: LM nl, LAD 50p, 48m, 70d, LCX nl, OM1 95ost, OM2 nl, RCA 100ost (2.75x20 Veriflex BMS), PDA 40p, EF 50%.  Residual dzs medically managed.;  b.05/2012 Echo: EF 60-65%, mild LVH;  c. 04/2013 Cath/PCI: LM nl, LAD 30p, 93m(3.0x16 Rebel BMS), 50-28m/d, LCX nl, OM1 90p(2.5x12 Rebel BMS), RCA 20p ISR, EF 55-60.  Marland Kitchen Cancer ([place])    a. 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 GERD (gastroesophageal reflux disease)   . Hypertension   . Prostate cancer ([place])   . Pulmonary emboli ([place])    a. recurrent - last 2012.  Chronic Coumadin  . Smokeless tobacco use    a. chew    Past Surgical History:  Procedure Laterality Date  . CATARACT EXTRACTION W/PHACO Left 03/18/2016   Procedure: CATARACT EXTRACTION PHACO AND INTRAOCULAR LENS PLACEMENT ; CDE:  11.45;  Surgeon: Williams Che, MD;  Location: [facility] ORS;  Service: Ophthalmology;  Laterality: Left;  . CHOLECYSTECTOMY    .  Head and neck surgery    . INGUINAL HERNIA REPAIR     x 2  . KIDNEY STONE SURGERY    . LEFT HEART CATH Right 05/12/2012   Procedure: LEFT HEART CATH;  Surgeon: Hillary Bow, MD;  Location[facility] CATH LAB;  Service: Cardiovascular;  Laterality: Right;  . LEFT HEART CATHETERIZATION WITH CORONARY ANGIOGRAM N/A 04/13/2013   Procedure: LEFT HEART CATHETERIZATION WITH CORONARY ANGIOGRAM;  Surgeon: Peter M Martinique, MD;  Location: [facility] CATH LAB;  Service: Cardiovascular;  Laterality: N/A;  . PERCUTANEOUS CORONARY STENT INTERVENTION (PCI-S) Right 05/12/2012   Procedure: PERCUTANEOUS CORONARY STENT INTERVENTION (PCI-S);  Surgeon: Hillary Bow, MD;  Location: [facility] CATH LAB;  Service: Cardiovascular;  Laterality: Right;  .  PERCUTANEOUS CORONARY STENT INTERVENTION (PCI-S)  04/13/2013   Procedure: PERCUTANEOUS CORONARY STENT INTERVENTION (PCI-S);  Surgeon: Peter M Martinique, MD;  Location: [facility] CATH LAB;  Service: Cardiovascular;;  BMS  Mid LAD and  prox OM1    ROS:  ***  As stated in the HPI and negative for all other systems.  PHYSICAL EXAM There were no vitals taken for this visit. GENERAL:  Well appearing HEENT:  Pupils equal round and reactive, fundi not visualized, oral mucosa unremarkable, edentulous, status post surgical resection of an oral cancer NECK:  No jugular venous distention, waveform within normal limits, carotid upstroke brisk and symmetric, no bruits, no thyromegaly LUNGS:  Clear to auscultation bilaterally CHEST:  Unremarkable HEART:  PMI not displaced or sustained,S1 and S2 within normal limits, no S3, no S4, no clicks, no rubs, no murmurs ABD:  Flat, positive bowel sounds normal in frequency in pitch, no bruits, no rebound, no guarding, no midline pulsatile mass, no hepatomegaly, no splenomegaly EXT:  2 plus pulses upper and right radial, no edema, no cyanosis no clubbing, mild bruising in the right thigh,  absent left radial where he has had surgery   EKG:  Sinus rhythm, rate ***, axis within normal limits, right bundle branch block, no acute ST-T wave changes.  06/06/2016  ASSESSMENT AND PLAN  CAD:   Given the symptoms that he has described I will bring him back for a Lexiscan Myoview.  HTN:  The blood pressure is mildly elevated recently she's been under stress worrying about his children. Typically his blood pressure is well controlled and he will remain on the meds as listed.  WARFARIN THERAPY:  This is followed by TAPPER,DAVID B, MD.    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  TOBACCO USE:  I tell him every time to stop chewing.   RISK REDUCTION:  He has not wanted to take a statin. Therefore, I will defer further attempts at starting statin therapy.Marland Kitchen

## 2016-06-07 ENCOUNTER — Ambulatory Visit: Payer: Medicare Other | Admitting: Cardiology

## 2016-06-07 ENCOUNTER — Encounter: Payer: Self-pay | Admitting: Cardiology

## 2016-06-10 ENCOUNTER — Ambulatory Visit: Payer: Medicare Other | Admitting: Adult Health

## 2016-06-19 ENCOUNTER — Ambulatory Visit: Payer: Medicare Other | Admitting: Physician Assistant

## 2017-05-13 NOTE — Progress Notes (Signed)
HPI The patient presents for followup after PCI and bare-metal stenting of the LAD and circumflex in Sept 2013. In Sept of 2014 he presented with chest discomfort and was subsequently found to have disease requiring bare-metal stenting to the circumflex and in the LAD.   He returns for yearly follow-up.   He did have some chest pain last year and had a negative Lexiscan Myoview.    Since I last saw him he has done well.  The patient denies any new symptoms such as chest discomfort, neck or arm discomfort. There has been no new shortness of breath, PND or orthopnea. There have been no reported palpitations, presyncope or syncope. He is able to push a mower without symptoms.   Allergies  Allergen Reactions  . Tylenol With Codeine #3  [Acetaminophen-Codeine] Nausea And Vomiting    Current Outpatient Prescriptions  Medication Sig Dispense Refill  . ALPRAZolam (XANAX) 1 MG tablet Take 1 mg by mouth 3 (three) times daily as needed for anxiety. For anxiety    . Cholecalciferol (VITAMIN D PO) Take 1 tablet by mouth daily.    Marland Kitchen HYDROcodone-acetaminophen (VICODIN) 5-500 MG per tablet Take 1 tablet by mouth every 6 (six) hours as needed for pain.     . metoprolol succinate (TOPROL-XL) 25 MG 24 hr tablet Take 25 mg by mouth 2 (two) times daily.     . nitroGLYCERIN (NITROSTAT) 0.4 MG SL tablet Place 1 tablet (0.4 mg total) under the tongue every 5 (five) minutes x 3 doses as needed for chest pain. NEED OV. 25 tablet prn  . omeprazole (PRILOSEC) 20 MG capsule Take 20 mg by mouth daily.    . temazepam (RESTORIL) 15 MG capsule Take 15 mg by mouth at bedtime as needed for sleep.    . vitamin B-12 (CYANOCOBALAMIN) 1000 MCG tablet Take 1,000 mcg by mouth daily.    Marland Kitchen warfarin (COUMADIN) 5 MG tablet Take 5 mg by mouth daily.      No current facility-administered medications for this visit.     Past Medical History:  Diagnosis Date  . Anxiety   . CAD (coronary artery disease)    a. 05/2012 Acute  Inf/Post MI, Cath/PCI: LM nl, LAD 50p, 70m, 70d, LCX nl, OM1 95ost, OM2 nl, RCA 100ost (2.75x20 Veriflex BMS), PDA 40p, EF 50%.  Residual dzs medically managed.;  b.05/2012 Echo: EF 60-65%, mild LVH;  c. 04/2013 Cath/PCI: LM nl, LAD 30p, 34m(3.0x16 Rebel BMS), 50-31m/d, LCX nl, OM1 90p(2.5x12 Rebel BMS), RCA 20p ISR, EF 55-60.  Marland Kitchen Cancer ([place])    a. 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 GERD (gastroesophageal reflux disease)   . Hypertension   . Prostate cancer ([place])   . 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 Smokeless tobacco use    a. chew    Past Surgical History:  Procedure Laterality Date  . CATARACT EXTRACTION W/PHACO Left 03/18/2016   Procedure: CATARACT EXTRACTION PHACO AND INTRAOCULAR LENS PLACEMENT ; CDE:  11.45;  Surgeon: Williams Che, MD;  Location: [facility];  Service: Ophthalmology;  Laterality: Left;  . CHOLECYSTECTOMY    . Head and neck surgery    . INGUINAL HERNIA REPAIR     x 2  . KIDNEY STONE SURGERY    . LEFT HEART CATH Right 05/12/2012   Procedure: LEFT HEART CATH;  Surgeon: Hillary Bow, MD;  Location: [facility] CATH LAB;  Service: Cardiovascular;  Laterality: Right;  . LEFT HEART CATHETERIZATION WITH CORONARY ANGIOGRAM N/A 04/13/2013   Procedure: LEFT HEART CATHETERIZATION WITH CORONARY ANGIOGRAM;  Surgeon: Peter M Martinique, MD;  Location: [facility] CATH LAB;  Service: Cardiovascular;  Laterality: N/A;  . PERCUTANEOUS CORONARY STENT INTERVENTION (PCI-S) Right 05/12/2012   Procedure: PERCUTANEOUS CORONARY STENT INTERVENTION (PCI-S);  Surgeon: Hillary Bow, MD;  Location: [facility] CATH LAB;  Service: Cardiovascular;  Laterality: Right;  . PERCUTANEOUS CORONARY STENT INTERVENTION (PCI-S)  04/13/2013   Procedure: PERCUTANEOUS CORONARY STENT INTERVENTION (PCI-S);  Surgeon: Peter M Martinique, MD;  Location: [facility] CATH LAB;  Service: Cardiovascular;;  BMS  Mid LAD and  prox OM1    ROS:  As stated in the HPI and negative for all other  systems.  PHYSICAL EXAM BP 130/80   Pulse 64   Ht 5\' 9"  (1.753 m)   Wt 171 lb (77.6 kg)   BMI 25.25 kg/m   GENERAL:  Well appearing NECK:  No jugular venous distention, waveform within normal limits, carotid upstroke brisk and symmetric, no bruits, no thyromegaly LUNGS:  Clear to auscultation bilaterally CHEST:  Unremarkable HEART:  PMI not displaced or sustained,S1 and S2 within normal limits, no S3, no S4, no clicks, no rubs, no murmurs ABD:  Flat, positive bowel sounds normal in frequency in pitch, no bruits, no rebound, no guarding, no midline pulsatile mass, no hepatomegaly, no splenomegaly EXT:  2 plus pulses throughout, no edema, no cyanosis no clubbing, absent left radial pulse.    ASSESSMENT AND PLAN  CAD:   He has had no symptoms since the last stress test last year.  No change in therapy or further testing is indicated.   HTN:  The blood pressure is at target. No change in medications is indicated. We will continue with therapeutic lifestyle changes (TLC).  WARFARIN THERAPY:  This is followed by Dr. Scotty Court.  No change in therapy is indicated.   TOBACCO USE:   I again told him to stop smoking.   RISK REDUCTION:   He does not want to take a statin.

## 2017-05-14 ENCOUNTER — Ambulatory Visit (INDEPENDENT_AMBULATORY_CARE_PROVIDER_SITE_OTHER): Payer: Medicare Other | Admitting: Cardiology

## 2017-05-14 ENCOUNTER — Encounter: Payer: Self-pay | Admitting: Cardiology

## 2017-05-14 VITALS — BP 130/80 | HR 64 | Ht 69.0 in | Wt 171.0 lb

## 2017-05-14 DIAGNOSIS — I10 Essential (primary) hypertension: Secondary | ICD-10-CM

## 2017-05-14 DIAGNOSIS — I2510 Atherosclerotic heart disease of native coronary artery without angina pectoris: Secondary | ICD-10-CM

## 2017-05-14 MED ORDER — NITROGLYCERIN 0.4 MG SL SUBL: 0.4000 mg | SUBLINGUAL_TABLET | SUBLINGUAL | 99 refills | Status: AC | PRN

## 2017-05-14 NOTE — Patient Instructions (Signed)

## 2017-05-15 ENCOUNTER — Encounter: Payer: Self-pay | Admitting: Cardiology

## 2017-05-15 ENCOUNTER — Ambulatory Visit: Payer: Medicare Other | Admitting: Cardiology

## 2017-08-06 IMAGING — NM NM MYOCAR MULTI W/SPECT W/WALL MOTION & EF
2 series · 12 of 12 positions shown · non-contrast
Comparison: none

[Series 1: rest · 8.28mm/px · 6 of 64 frames shown]
[frame 6/64]
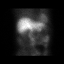
[frame 16/64]
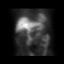
[frame 27/64]
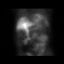
[frame 38/64]
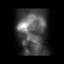
[frame 48/64]
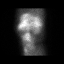
[frame 59/64]
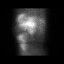

[Series 2: stress gated · 8.28mm/px · 6 of 64 frames shown]
[frame 6/64]
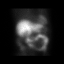
[frame 16/64]
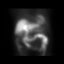
[frame 27/64]
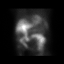
[frame 38/64]
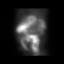
[frame 48/64]
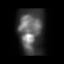
[frame 59/64]
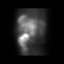

[12 of 12 positions shown; findings below may reference images not displayed]

Canned report from images found in remote index.

Refer to host system for actual result text.

## 2018-01-18 IMAGING — CR DG ABDOMEN 1V
1 series · 1 of 1 positions shown · non-contrast
Comparison: CT dated 08/04/2015

CLINICAL DATA: 75-year-old male with constipation and abdominal
pain.

EXAM:
ABDOMEN - 1 VIEW

[supine ap]
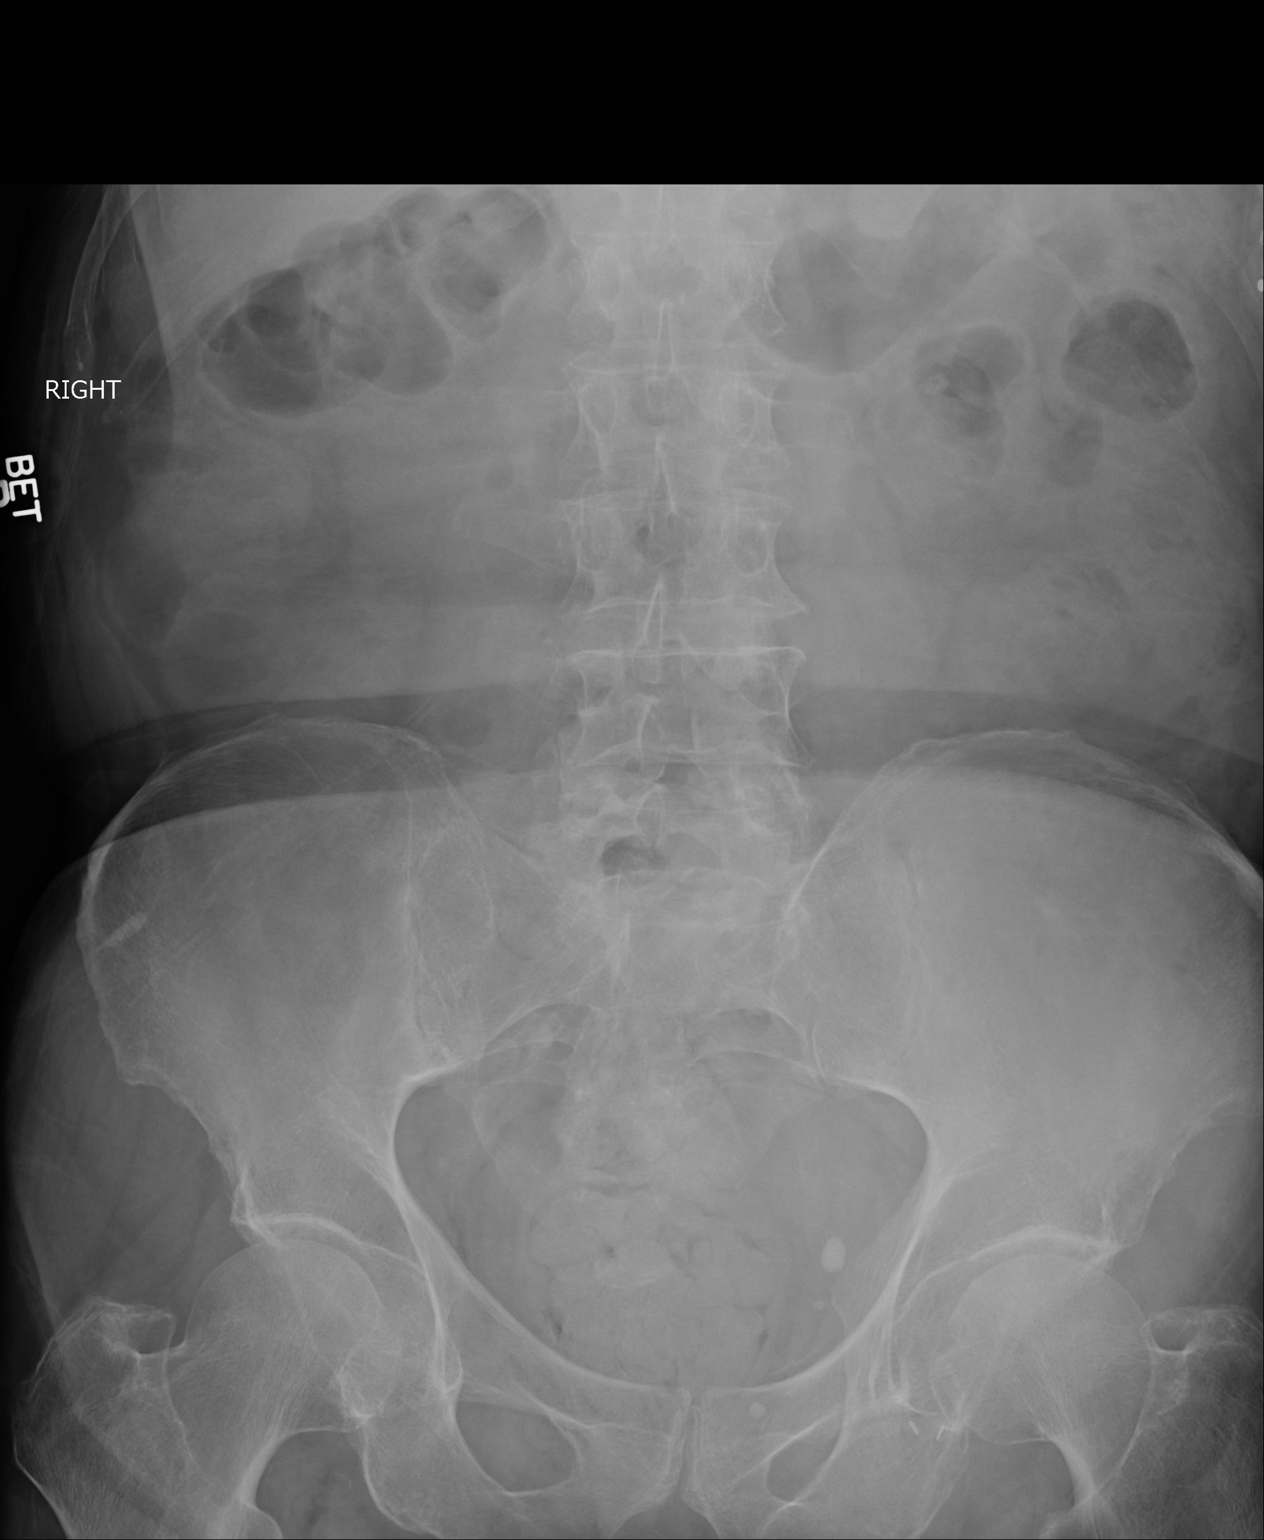

[1 of 1 positions shown; findings below may reference images not displayed]

FINDINGS: There is moderate stool throughout the colon. Dense stool noted in
the rectosigmoid. No bowel dilatation or evidence of obstruction. No
free air go to. A 6 mm amorphous radiopaque focus over the left
renal silhouette may be related to stool content or represent a
renal calculus. The osseous structures and soft tissues are
otherwise unremarkable.
IMPRESSION: Constipation with possible fecal impaction in the rectum. No bowel
obstruction.

## 2018-08-28 ENCOUNTER — Telehealth: Payer: Self-pay | Admitting: Cardiology

## 2018-08-28 NOTE — Telephone Encounter (Signed)
Anthony Dodson called requesting to see if he can switch doctors to the [facility] due to transportation issues. He has seen Dr. Percival Spanish in the past. Patient requested Dr. Bronson Ing.

## 2018-08-30 NOTE — Telephone Encounter (Signed)
Sure

## 2018-08-31 NOTE — Telephone Encounter (Signed)
That would be fine 

## 2018-09-24 ENCOUNTER — Encounter: Payer: Self-pay | Admitting: Cardiovascular Disease

## 2018-09-24 ENCOUNTER — Ambulatory Visit (INDEPENDENT_AMBULATORY_CARE_PROVIDER_SITE_OTHER): Payer: Medicare Other | Admitting: Pharmacist

## 2018-09-24 ENCOUNTER — Ambulatory Visit (INDEPENDENT_AMBULATORY_CARE_PROVIDER_SITE_OTHER): Payer: Medicare Other | Admitting: Cardiovascular Disease

## 2018-09-24 ENCOUNTER — Encounter: Payer: Self-pay | Admitting: *Deleted

## 2018-09-24 VITALS — BP 133/77 | HR 62 | Ht 69.0 in | Wt 173.4 lb

## 2018-09-24 DIAGNOSIS — I2782 Chronic pulmonary embolism: Secondary | ICD-10-CM

## 2018-09-24 DIAGNOSIS — I452 Bifascicular block: Secondary | ICD-10-CM

## 2018-09-24 DIAGNOSIS — Z7189 Other specified counseling: Secondary | ICD-10-CM

## 2018-09-24 DIAGNOSIS — Z5181 Encounter for therapeutic drug level monitoring: Secondary | ICD-10-CM | POA: Insufficient documentation

## 2018-09-24 DIAGNOSIS — I25118 Atherosclerotic heart disease of native coronary artery with other forms of angina pectoris: Secondary | ICD-10-CM | POA: Diagnosis not present

## 2018-09-24 DIAGNOSIS — Z955 Presence of coronary angioplasty implant and graft: Secondary | ICD-10-CM | POA: Diagnosis not present

## 2018-09-24 LAB — POCT INR: INR: 1.2 — AB (ref 2.0–3.0)

## 2018-09-24 NOTE — Patient Instructions (Signed)
Medication Instructions:  Continue all current medications.  Labwork:   Testing/Procedures:   Follow-Up: Your physician wants you to follow up in: 6 months.  You will receive a reminder letter in the mail one-two months in advance.  If you don't receive a letter, please call our office to schedule the follow up appointment   Any Other Special Instructions Will Be Listed Below (If Applicable). Enroll in our coumadin clinic.   If you need a refill on your cardiac medications before your next appointment, please call your pharmacy.

## 2018-09-24 NOTE — Progress Notes (Signed)
SUBJECTIVE: The patient presents to establish care with me in our [place] office.  He has a history of multivessel coronary stenting.  Last coronary angiogram was on 04/13/2013.  He has a prior history of inferior MI in 2013 with stenting of the proximal RCA at that time.  In September 2014 he underwent successful stenting of the mid LAD and first obtuse marginal with bare-metal stents.  Echocardiogram on 10-13 showed normal left ventricular systolic function and mild LVH, EF 60 to 65%.  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  He has a history of prostate cancer as well.    His PCP is Dr. Francee Piccolo in [place], [place].  The patient denies any symptoms of chest pain, palpitations, shortness of breath, lightheadedness, dizziness, leg swelling, orthopnea, PND, and syncope.  ECG performed in the office today which I ordered and personally interpreted demonstrates normal sinus rhythm with right bundle branch block and left anterior fascicular block.  He told me that his wife is in very poor health and is currently hospitalized at [facility].    Review of Systems: As per "subjective", otherwise negative.  Allergies  Allergen Reactions  . Tylenol With Codeine #3  [Acetaminophen-Codeine] Nausea And Vomiting    Current Outpatient Medications  Medication Sig Dispense Refill  . ALPRAZolam (XANAX) 1 MG tablet Take 1 mg by mouth 3 (three) times daily as needed for anxiety. For anxiety    . atorvastatin (LIPITOR) 80 MG tablet Take 1 tablet by mouth daily.    . Cholecalciferol (VITAMIN D PO) Take 1 tablet by mouth daily.    Marland Kitchen HYDROcodone-acetaminophen (NORCO) 7.5-325 MG tablet Take 1 tablet by mouth every 4 (four) hours as needed.    . metoprolol succinate (TOPROL-XL) 25 MG 24 hr tablet Take 25 mg by mouth 2 (two) times daily.     . nitroGLYCERIN (NITROSTAT) 0.4 MG SL tablet Place 1 tablet (0.4 mg total) under the tongue every 5 (five)  minutes x 3 doses as needed for chest pain. NEED OV. 25 tablet prn  . omeprazole (PRILOSEC) 20 MG capsule Take 20 mg by mouth daily.    . temazepam (RESTORIL) 15 MG capsule Take 15 mg by mouth at bedtime as needed for sleep.    . vitamin B-12 (CYANOCOBALAMIN) 1000 MCG tablet Take 1,000 mcg by mouth daily.    Marland Kitchen warfarin (COUMADIN) 5 MG tablet Take 5 mg by mouth daily.      No current facility-administered medications for this visit.     Past Medical History:  Diagnosis Date  . Anxiety   . CAD (coronary artery disease)    a. 05/2012 Acute Inf/Post MI, Cath/PCI: LM nl, LAD 50p, 36m, 70d, LCX nl, OM1 95ost, OM2 nl, RCA 100ost (2.75x20 Veriflex BMS), PDA 40p, EF 50%.  Residual dzs medically managed.;  b.05/2012 Echo: EF 60-65%, mild LVH;  c. 04/2013 Cath/PCI: LM nl, LAD 30p, 81m(3.0x16 Rebel BMS), 50-38m/d, LCX nl, OM1 90p(2.5x12 Rebel BMS), RCA 20p ISR, EF 55-60.  Marland Kitchen Cancer ([place])    a. 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 GERD (gastroesophageal reflux disease)   . Hypertension   . Prostate cancer ([place])   . 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 Smokeless tobacco use    a. chew    Past Surgical History:  Procedure Laterality Date  . CATARACT EXTRACTION W/PHACO Left 03/18/2016  Procedure: CATARACT EXTRACTION PHACO AND INTRAOCULAR LENS PLACEMENT ; CDE:  11.45;  Surgeon: Williams Che, MD;  Location: [facility];  Service: Ophthalmology;  Laterality: Left;  . CHOLECYSTECTOMY    . Head and neck surgery    . INGUINAL HERNIA REPAIR     x 2  . KIDNEY STONE SURGERY    . LEFT HEART CATH Right 05/12/2012   Procedure: LEFT HEART CATH;  Surgeon: Hillary Bow, MD;  Location: [facility] CATH LAB;  Service: Cardiovascular;  Laterality: Right;  . LEFT HEART CATHETERIZATION WITH CORONARY ANGIOGRAM N/A 04/13/2013   Procedure: LEFT HEART CATHETERIZATION WITH CORONARY ANGIOGRAM;  Surgeon: Peter M Martinique, MD;  Location: [facility] CATH LAB;  Service: Cardiovascular;   Laterality: N/A;  . PERCUTANEOUS CORONARY STENT INTERVENTION (PCI-S) Right 05/12/2012   Procedure: PERCUTANEOUS CORONARY STENT INTERVENTION (PCI-S);  Surgeon: Hillary Bow, MD;  Location: [facility] CATH LAB;  Service: Cardiovascular;  Laterality: Right;  . PERCUTANEOUS CORONARY STENT INTERVENTION (PCI-S)  04/13/2013   Procedure: PERCUTANEOUS CORONARY STENT INTERVENTION (PCI-S);  Surgeon: Peter M Martinique, MD;  Location: [facility] CATH LAB;  Service: Cardiovascular;;  BMS  Mid LAD and  prox OM1    Social History   Socioeconomic History  . Marital status: Married    Spouse name: Not on file  . Number of children: Not on file  . Years of education: Not on file  . Highest education level: Not on file  Occupational History  . Not on file  Social Needs  . Financial resource strain: Not on file  . Food insecurity:    Worry: Not on file    Inability: Not on file  . Transportation needs:    Medical: Not on file    Non-medical: Not on file  Tobacco Use  . Smoking status: Never Smoker  . Smokeless tobacco: Current User    Types: Chew  . 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 Alcohol use: No  . Drug use: No  . Sexual activity: Yes  Lifestyle  . Physical activity:    Days per week: Not on file    Minutes per session: Not on file  . Stress: Not on file  Relationships  . Social connections:    Talks on phone: Not on file    Gets together: Not on file    Attends religious service: Not on file    Active member of club or organization: Not on file    Attends meetings of clubs or organizations: Not on file    Relationship status: Not on file  . Intimate partner violence:    Fear of current or ex partner: Not on file    Emotionally abused: Not on file    Physically abused: Not on file    Forced sexual activity: Not on file  Other Topics Concern  . Not on file  Social History Narrative   Lives in  [place], [place] with fiance.  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     Vitals:   09/24/18 0958  BP: 133/77  Pulse: 62  Weight: 173 lb 6.4 oz (78.7 kg)  Height: 5\' 9"  (1.753 m)    Wt Readings from Last 3 Encounters:  09/24/18 173 lb 6.4 oz (78.7 kg)  05/14/17 171 lb (77.6 kg)  05/28/16 162 lb (73.5 kg)     PHYSICAL EXAM General: NAD HEENT: Normal. Neck:  No JVD, no thyromegaly. Lungs: Clear to auscultation bilaterally with normal respiratory effort. CV: Regular rate and rhythm, normal S1/S2, no S3/S4, no murmur. No pretibial or periankle edema.  No carotid bruit.   Abdomen: Soft, nontender, no distention.  Neurologic: Alert and oriented.  Psych: Normal affect. Skin: Normal. Musculoskeletal: No gross deformities.    ECG: Reviewed above under Subjective   Labs: Lab Results  Component Value Date/Time   K 4.1 05/28/2016 11:51 PM   BUN 19 05/28/2016 11:51 PM   CREATININE 1.56 (H) 05/28/2016 11:51 PM   TSH 0.969 05/12/2012 09:43 PM   HGB 14.2 05/28/2016 11:51 PM     Lipids: Lab Results  Component Value Date/Time   LDLCALC 60 05/13/2012 03:11 AM   CHOL 141 05/13/2012 03:11 AM   TRIG 283 (H) 05/13/2012 03:11 AM   HDL 24 (L) 05/13/2012 03:11 AM       ASSESSMENT AND PLAN: 1.  Coronary artery disease: He has a history of multivessel PCI with details noted above.  Symptomatically stable.  Continue Toprol-XL 25 mg twice daily and atorvastatin 80 mg.  I will request a copy of his labs from his PCP.  2.  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  I will have his INR checked today and enroll him in our anticoagulation clinic for routine monitoring going forward.  3.  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    Disposition: Follow up 6 months  Time spent: 40 minutes, of which greater than 50% was spent reviewing symptoms, relevant blood tests and studies, and discussing  management plan with the patient.    Kate Sable, M.D., F.A.C.C.

## 2018-09-24 NOTE — Patient Instructions (Addendum)
Description   Take 1 1/2 tablets (4mg ) today and tomorrow then continue taking 4mg  every day. Put the 5mg  tablets aside for now. Recheck INR in 1 week.       A full discussion of the nature of anticoagulants has been carried out.  A benefit risk analysis has been presented to the patient, so that they understand the justification for choosing anticoagulation at this time. The need for frequent and regular monitoring, precise dosage adjustment and compliance is stressed.  Side effects of potential bleeding are discussed.  The patient should avoid any OTC items containing aspirin or ibuprofen, and should avoid great swings in general diet.  Avoid alcohol consumption.  Call if any signs of abnormal bleeding.

## 2018-10-01 ENCOUNTER — Ambulatory Visit (INDEPENDENT_AMBULATORY_CARE_PROVIDER_SITE_OTHER): Payer: Medicare Other | Admitting: *Deleted

## 2018-10-01 DIAGNOSIS — I2782 Chronic pulmonary embolism: Secondary | ICD-10-CM

## 2018-10-01 DIAGNOSIS — Z5181 Encounter for therapeutic drug level monitoring: Secondary | ICD-10-CM | POA: Diagnosis not present

## 2018-10-01 LAB — POCT INR: INR: 2.2 (ref 2.0–3.0)

## 2018-10-01 NOTE — Patient Instructions (Signed)
Continue coumadin 4mg  ( 1 tablet ) daily. Recheck in 2 weeks

## 2018-10-15 ENCOUNTER — Ambulatory Visit (INDEPENDENT_AMBULATORY_CARE_PROVIDER_SITE_OTHER): Payer: Medicare Other | Admitting: *Deleted

## 2018-10-15 DIAGNOSIS — Z5181 Encounter for therapeutic drug level monitoring: Secondary | ICD-10-CM

## 2018-10-15 DIAGNOSIS — I2782 Chronic pulmonary embolism: Secondary | ICD-10-CM

## 2018-10-15 LAB — POCT INR: INR: 2.4 (ref 2.0–3.0)

## 2018-10-15 NOTE — Patient Instructions (Signed)
Continue coumadin 4mg  ( 1 tablet ) daily. Recheck in 3 weeks

## 2018-12-08 ENCOUNTER — Ambulatory Visit (INDEPENDENT_AMBULATORY_CARE_PROVIDER_SITE_OTHER): Payer: Medicare Other | Admitting: *Deleted

## 2018-12-08 DIAGNOSIS — I2782 Chronic pulmonary embolism: Secondary | ICD-10-CM | POA: Diagnosis not present

## 2018-12-08 DIAGNOSIS — Z5181 Encounter for therapeutic drug level monitoring: Secondary | ICD-10-CM | POA: Diagnosis not present

## 2018-12-08 LAB — POCT INR: INR: 2.2 (ref 2.0–3.0)

## 2018-12-08 NOTE — Patient Instructions (Signed)
Continue coumadin 4mg  ( 1 tablet ) daily. Recheck in 4 weeks

## 2019-01-13 ENCOUNTER — Telehealth: Payer: Self-pay | Admitting: *Deleted

## 2019-01-13 NOTE — Telephone Encounter (Signed)
° °  COVID-19 Pre-Screening Questions: ° °• Do you currently have a fever?NO ° ° °• Have you recently travelled on a cruise, internationally, or to [place], [place], [place], [place], [place], or [place], [place] ([employer]) ? NO °•  °• Have you been in contact with someone that is currently pending confirmation of Covid19 testing or has been confirmed to have the Covid19 virus?  NO °•  °Are you currently experiencing fatigue or cough? NO ° ° °   ° ° ° ° °

## 2019-01-14 ENCOUNTER — Ambulatory Visit (INDEPENDENT_AMBULATORY_CARE_PROVIDER_SITE_OTHER): Payer: Medicare Other | Admitting: *Deleted

## 2019-01-14 DIAGNOSIS — I2782 Chronic pulmonary embolism: Secondary | ICD-10-CM | POA: Diagnosis not present

## 2019-01-14 DIAGNOSIS — Z5181 Encounter for therapeutic drug level monitoring: Secondary | ICD-10-CM | POA: Diagnosis not present

## 2019-01-14 LAB — POCT INR: INR: 2.8 (ref 2.0–3.0)

## 2019-01-14 NOTE — Patient Instructions (Signed)
Continue coumadin 4mg  ( 1 tablet ) daily. Recheck in 4 weeks

## 2019-02-08 NOTE — Telephone Encounter (Signed)
-----   Message from Merlene Laughter, RN sent at 02/08/2019  3:18 PM EDT ----- Regarding: exposed to covid-19 on 06/22 Was told to quarantine for 14 days-was scheduled to have INR check on 07/08. I rescheduled to 07/16

## 2019-02-25 ENCOUNTER — Ambulatory Visit (INDEPENDENT_AMBULATORY_CARE_PROVIDER_SITE_OTHER): Payer: Medicare Other | Admitting: *Deleted

## 2019-02-25 DIAGNOSIS — Z5181 Encounter for therapeutic drug level monitoring: Secondary | ICD-10-CM | POA: Diagnosis not present

## 2019-02-25 DIAGNOSIS — I2782 Chronic pulmonary embolism: Secondary | ICD-10-CM | POA: Diagnosis not present

## 2019-02-25 LAB — POCT INR: INR: 2.9 (ref 2.0–3.0)

## 2019-02-25 NOTE — Patient Instructions (Signed)
Continue coumadin 4mg  ( 1 tablet ) daily. Recheck in 4 weeks

## 2019-03-31 ENCOUNTER — Telehealth: Payer: Self-pay | Admitting: Cardiovascular Disease

## 2019-03-31 NOTE — Telephone Encounter (Signed)

## 2019-04-01 ENCOUNTER — Other Ambulatory Visit: Payer: Self-pay

## 2019-04-01 ENCOUNTER — Ambulatory Visit (INDEPENDENT_AMBULATORY_CARE_PROVIDER_SITE_OTHER): Payer: Medicare Other | Admitting: Cardiovascular Disease

## 2019-04-01 ENCOUNTER — Encounter: Payer: Self-pay | Admitting: Cardiovascular Disease

## 2019-04-01 ENCOUNTER — Encounter: Payer: Self-pay | Admitting: *Deleted

## 2019-04-01 ENCOUNTER — Ambulatory Visit (INDEPENDENT_AMBULATORY_CARE_PROVIDER_SITE_OTHER): Payer: Medicare Other | Admitting: *Deleted

## 2019-04-01 VITALS — BP 118/70 | HR 56 | Temp 98.9°F | Ht 69.0 in | Wt 167.0 lb

## 2019-04-01 DIAGNOSIS — I25118 Atherosclerotic heart disease of native coronary artery with other forms of angina pectoris: Secondary | ICD-10-CM

## 2019-04-01 DIAGNOSIS — I452 Bifascicular block: Secondary | ICD-10-CM

## 2019-04-01 DIAGNOSIS — Z955 Presence of coronary angioplasty implant and graft: Secondary | ICD-10-CM

## 2019-04-01 DIAGNOSIS — I2782 Chronic pulmonary embolism: Secondary | ICD-10-CM | POA: Diagnosis not present

## 2019-04-01 DIAGNOSIS — Z5181 Encounter for therapeutic drug level monitoring: Secondary | ICD-10-CM

## 2019-04-01 LAB — POCT INR: INR: 2 (ref 2.0–3.0)

## 2019-04-01 NOTE — Patient Instructions (Addendum)

## 2019-04-01 NOTE — Patient Instructions (Signed)
Description   Continue coumadin 4mg  ( 1 tablet ) daily. Recheck in 4 weeks.

## 2019-04-01 NOTE — Progress Notes (Signed)
SUBJECTIVE: The patient presents for routine follow-up.  I initially evaluated him on 09/24/2018.  In summary, he has a history of multivessel coronary stenting.  Last coronary angiogram was on 04/13/2013.  He has a prior history of inferior MI in 2013 with stenting of the proximal RCA at that time.  In September 2014 he underwent successful stenting of the mid LAD and first obtuse marginal with bare-metal stents.  Echocardiogram on 10-13 showed normal left ventricular systolic function and mild LVH, EF 60 to 65%.  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 He has a history of prostate cancer as well.    His PCP is Dr. Francee Piccolo in [place], [place].  He has been his physician for 43 years.  The patient denies any symptoms of chest pain, palpitations, shortness of breath, lightheadedness, dizziness, leg swelling, orthopnea, PND, and syncope.  He has a strong faith.  Social history: His first wife died in 56.  He has been remarried since 2013.  He and his first wife fostered 74 children, 13 at 1 time!  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  He golfed a round with Research officer, trade union, [employer], and [employer].    Review of Systems: As per "subjective", otherwise negative.  Allergies  Allergen Reactions  . Tylenol With Codeine #3  [Acetaminophen-Codeine] Nausea And Vomiting    Current Outpatient Medications  Medication Sig Dispense Refill  . ALPRAZolam (XANAX) 1 MG tablet Take 1 mg by mouth 3 (three) times daily as needed for anxiety. For anxiety    . atorvastatin (LIPITOR) 80 MG tablet Take 1 tablet by mouth daily.    . Cholecalciferol (VITAMIN D PO) Take 1 tablet by mouth daily.    Marland Kitchen HYDROcodone-acetaminophen (NORCO) 7.5-325 MG tablet Take 1 tablet by mouth every 4 (four) hours as needed.    . metoprolol succinate (TOPROL-XL) 25 MG 24 hr tablet Take 25 mg by mouth 2 (two) times daily.     . nitroGLYCERIN (NITROSTAT)  0.4 MG SL tablet Place 1 tablet (0.4 mg total) under the tongue every 5 (five) minutes x 3 doses as needed for chest pain. NEED OV. 25 tablet prn  . omeprazole (PRILOSEC) 20 MG capsule Take 20 mg by mouth daily.    . temazepam (RESTORIL) 15 MG capsule Take 15 mg by mouth at bedtime as needed for sleep.    . vitamin B-12 (CYANOCOBALAMIN) 1000 MCG tablet Take 1,000 mcg by mouth daily.    Marland Kitchen warfarin (COUMADIN) 5 MG tablet Take 5 mg by mouth daily.      No current facility-administered medications for this visit.     Past Medical History:  Diagnosis Date  . Anxiety   . CAD (coronary artery disease)    a. 05/2012 Acute Inf/Post MI, Cath/PCI: LM nl, LAD 50p, 45m, 70d, LCX nl, OM1 95ost, OM2 nl, RCA 100ost (2.75x20 Veriflex BMS), PDA 40p, EF 50%.  Residual dzs medically managed.;  b.05/2012 Echo: EF 60-65%, mild LVH;  c. 04/2013 Cath/PCI: LM nl, LAD 30p, 43m(3.0x16 Rebel BMS), 50-43m/d, LCX nl, OM1 90p(2.5x12 Rebel BMS), RCA 20p ISR, EF 55-60.  Marland Kitchen Cancer ([place])    a. 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 GERD (gastroesophageal reflux disease)   . Hypertension   . Prostate cancer ([place])   . Pulmonary emboli ([place])    a. recurrent - last 2012.  Chronic Coumadin  . Smokeless tobacco use  a. chew    Past Surgical History:  Procedure Laterality Date  . CATARACT EXTRACTION W/PHACO Left 03/18/2016   Procedure: CATARACT EXTRACTION PHACO AND INTRAOCULAR LENS PLACEMENT ; CDE:  11.45;  Surgeon: Williams Che, MD;  Location: [facility];  Service: Ophthalmology;  Laterality: Left;  . CHOLECYSTECTOMY    . Head and neck surgery    . INGUINAL HERNIA REPAIR     x 2  . KIDNEY STONE SURGERY    . LEFT HEART CATH Right 05/12/2012   Procedure: LEFT HEART CATH;  Surgeon: Hillary Bow, MD;  Location: [facility] CATH LAB;  Service: Cardiovascular;  Laterality: Right;  . LEFT HEART CATHETERIZATION WITH CORONARY ANGIOGRAM N/A 04/13/2013   Procedure: LEFT HEART CATHETERIZATION WITH CORONARY ANGIOGRAM;   Surgeon: Peter M Martinique, MD;  Location: [facility] CATH LAB;  Service: Cardiovascular;  Laterality: N/A;  . PERCUTANEOUS CORONARY STENT INTERVENTION (PCI-S) Right 05/12/2012   Procedure: PERCUTANEOUS CORONARY STENT INTERVENTION (PCI-S);  Surgeon: Hillary Bow, MD;  Location: [facility] CATH LAB;  Service: Cardiovascular;  Laterality: Right;  . PERCUTANEOUS CORONARY STENT INTERVENTION (PCI-S)  04/13/2013   Procedure: PERCUTANEOUS CORONARY STENT INTERVENTION (PCI-S);  Surgeon: Peter M Martinique, MD;  Location: [facility] CATH LAB;  Service: Cardiovascular;;  BMS  Mid LAD and  prox OM1    Social History   Socioeconomic History  . Marital status: Married    Spouse name: Not on file  . Number of children: Not on file  . Years of education: Not on file  . Highest education level: Not on file  Occupational History  . Not on file  Social Needs  . Financial resource strain: Not on file  . Food insecurity    Worry: Not on file    Inability: Not on file  . Transportation needs    Medical: Not on file    Non-medical: Not on file  Tobacco Use  . Smoking status: Never Smoker  . Smokeless tobacco: Current User    Types: Chew  . 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 Alcohol use: No  . Drug use: No  . Sexual activity: Yes  Lifestyle  . Physical activity    Days per week: Not on file    Minutes per session: Not on file  . Stress: Not on file  Relationships  . Social Herbalist on phone: Not on file    Gets together: Not on file    Attends religious service: Not on file    Active member of club or organization: Not on file    Attends meetings of clubs or organizations: Not on file    Relationship status: Not on file  . Intimate partner violence    Fear of current or ex partner: Not on file    Emotionally abused: Not on file    Physically abused: Not on file    Forced sexual activity: Not on file  Other  Topics Concern  . Not on file  Social History Narrative   Lives in [place], [place] with fiance.  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     Vitals:   04/01/19 1256  Temp: 98.9 F (37.2 C)  Weight: 167 lb (75.8 kg)  Height: 5\' 9"  (1.753 m)    Wt Readings from Last 3 Encounters:  04/01/19 167 lb (75.8 kg)  09/24/18 173 lb 6.4 oz (  78.7 kg)  05/14/17 171 lb (77.6 kg)     PHYSICAL EXAM General: NAD HEENT: Normal. Neck: No JVD, no thyromegaly. Lungs: Clear to auscultation bilaterally with normal respiratory effort. CV: Regular rate and rhythm, normal S1/S2, no S3/S4, no murmur. No pretibial or periankle edema.  No carotid bruit.   Abdomen: Soft, nontender, no distention.  Neurologic: Alert and oriented.  Psych: Normal affect. Skin: Normal. Musculoskeletal: No gross deformities.    ECG: Reviewed above under Subjective   Labs: Lab Results  Component Value Date/Time   K 4.1 05/28/2016 11:51 PM   BUN 19 05/28/2016 11:51 PM   CREATININE 1.56 (H) 05/28/2016 11:51 PM   TSH 0.969 05/12/2012 09:43 PM   HGB 14.2 05/28/2016 11:51 PM     Lipids: Lab Results  Component Value Date/Time   LDLCALC 60 05/13/2012 03:11 AM   CHOL 141 05/13/2012 03:11 AM   TRIG 283 (H) 05/13/2012 03:11 AM   HDL 24 (L) 05/13/2012 03:11 AM       ASSESSMENT AND PLAN:  1.  Coronary artery disease: He has a history of multivessel PCI with details noted above.  Symptomatically stable.  Continue Toprol-XL 25 mg twice daily and atorvastatin 80 mg.   2.  Recurrent pulmonary emboli: He is on chronic warfarin therapy and followed in our anticoagulation clinic.  INR today is 2.  3.  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     Disposition: Follow up 6 months   Kate Sable, M.D., F.A.C.C.

## 2019-04-29 ENCOUNTER — Other Ambulatory Visit: Payer: Self-pay

## 2019-04-29 ENCOUNTER — Ambulatory Visit (INDEPENDENT_AMBULATORY_CARE_PROVIDER_SITE_OTHER): Payer: Medicare Other | Admitting: *Deleted

## 2019-04-29 DIAGNOSIS — I2782 Chronic pulmonary embolism: Secondary | ICD-10-CM | POA: Diagnosis not present

## 2019-04-29 DIAGNOSIS — Z5181 Encounter for therapeutic drug level monitoring: Secondary | ICD-10-CM | POA: Diagnosis not present

## 2019-04-29 LAB — POCT INR: INR: 3 (ref 2.0–3.0)

## 2019-04-29 NOTE — Patient Instructions (Signed)
Continue coumadin 4mg  ( 1 tablet ) daily. Recheck in 6 weeks.

## 2019-06-10 ENCOUNTER — Other Ambulatory Visit: Payer: Self-pay

## 2019-06-10 ENCOUNTER — Ambulatory Visit (INDEPENDENT_AMBULATORY_CARE_PROVIDER_SITE_OTHER): Payer: Medicare Other | Admitting: *Deleted

## 2019-06-10 DIAGNOSIS — Z5181 Encounter for therapeutic drug level monitoring: Secondary | ICD-10-CM | POA: Diagnosis not present

## 2019-06-10 DIAGNOSIS — I2782 Chronic pulmonary embolism: Secondary | ICD-10-CM

## 2019-06-10 LAB — POCT INR: INR: 2.8 (ref 2.0–3.0)

## 2019-06-10 NOTE — Patient Instructions (Signed)
Continue coumadin 4mg  ( 1 tablet ) daily. Recheck in 6 weeks.

## 2019-07-22 ENCOUNTER — Ambulatory Visit (INDEPENDENT_AMBULATORY_CARE_PROVIDER_SITE_OTHER): Payer: Medicare Other | Admitting: *Deleted

## 2019-07-22 ENCOUNTER — Other Ambulatory Visit: Payer: Self-pay

## 2019-07-22 DIAGNOSIS — Z5181 Encounter for therapeutic drug level monitoring: Secondary | ICD-10-CM

## 2019-07-22 DIAGNOSIS — I2782 Chronic pulmonary embolism: Secondary | ICD-10-CM | POA: Diagnosis not present

## 2019-07-22 LAB — POCT INR: INR: 4 — AB (ref 2.0–3.0)

## 2019-07-22 NOTE — Patient Instructions (Signed)
  Hold warfarin tonight, take 1/2 tablet tomorrow night then resume 4mg  ( 1 tablet ) daily. Recheck in 2 weeks.

## 2019-08-04 ENCOUNTER — Other Ambulatory Visit: Payer: Self-pay

## 2019-08-04 ENCOUNTER — Ambulatory Visit (INDEPENDENT_AMBULATORY_CARE_PROVIDER_SITE_OTHER): Payer: Medicare Other | Admitting: *Deleted

## 2019-08-04 DIAGNOSIS — I2782 Chronic pulmonary embolism: Secondary | ICD-10-CM

## 2019-08-04 DIAGNOSIS — Z5181 Encounter for therapeutic drug level monitoring: Secondary | ICD-10-CM

## 2019-08-04 LAB — POCT INR: INR: 3.7 — AB (ref 2.0–3.0)

## 2019-08-04 NOTE — Patient Instructions (Signed)
Hold warfarin tonight then decrease dose to 1 tablet daily except 1/2 tablet on Mondays and Thursdays Recheck in 3 weeks 

## 2019-08-09 ENCOUNTER — Telehealth: Payer: Self-pay | Admitting: Cardiovascular Disease

## 2019-08-09 NOTE — Telephone Encounter (Signed)
No c/o active chest pain currently.  Had episode of chest pain last evening - took 1 Ntg which did relieve the chest pain.  SOB since last evening - this is new for him.  Did have slight nausea last evening, but nothing now.  States he still feels like there may be a little pressure in chest, but no pain.  Did not go to ED last evening as he lives in [place] - they would have taken him to [place].  Will send message to provider.  In the meantime, advised him to go to ED for evaluation if symptoms worsen - states that he does have neighbor that could drive him to [facility] if need be.

## 2019-08-09 NOTE — Telephone Encounter (Signed)
Patient states that he started having CP and shortness of breath on Sunday 08-07-2019. Took 1 Nitroglycerin and it did help. Called EMS. They came out did an EKG but did not take patient to hospital  Was told to follow up with heart doctor.  States that she has slept all night.

## 2019-08-10 ENCOUNTER — Telehealth: Payer: Self-pay | Admitting: Cardiology

## 2019-08-10 ENCOUNTER — Telehealth (INDEPENDENT_AMBULATORY_CARE_PROVIDER_SITE_OTHER): Payer: Medicare Other | Admitting: Cardiology

## 2019-08-10 ENCOUNTER — Encounter: Payer: Self-pay | Admitting: Cardiology

## 2019-08-10 VITALS — BP 138/75 | Ht 69.0 in | Wt 176.0 lb

## 2019-08-10 DIAGNOSIS — R079 Chest pain, unspecified: Secondary | ICD-10-CM

## 2019-08-10 DIAGNOSIS — I2510 Atherosclerotic heart disease of native coronary artery without angina pectoris: Secondary | ICD-10-CM

## 2019-08-10 DIAGNOSIS — R0789 Other chest pain: Secondary | ICD-10-CM

## 2019-08-10 DIAGNOSIS — I2782 Chronic pulmonary embolism: Secondary | ICD-10-CM

## 2019-08-10 NOTE — Telephone Encounter (Signed)
Patient scheduled for VV with Dr. Harl Bowie this evening at 2:00 - ok to add per provider.

## 2019-08-10 NOTE — Progress Notes (Signed)
Virtual Visit via Telephone Note   This visit type was conducted due to national recommendations for restrictions regarding the COVID-19 Pandemic (e.g. social distancing) in an effort to limit this patient's exposure and mitigate transmission in our community.  Due to his co-morbid illnesses, this patient is at least at moderate risk for complications without adequate follow up.  This format is felt to be most appropriate for this patient at this time.  The patient did not have access to video technology/had technical difficulties with video requiring transitioning to audio format only (telephone).  All issues noted in this document were discussed and addressed.  No physical exam could be performed with this format.  Please refer to the patient's chart for his  consent to telehealth for [facility].   Date:  08/10/2019   ID:  Anthony Dodson, DOB [birth year], MRN [id number]  Patient Location: Home Provider Location: Office  PCP:  System, Provider Not In  Cardiologist:  Kate Sable, MD  Electrophysiologist:  None   Evaluation Performed:  Follow-Up Visit  Chief Complaint:  Chest pain  History of Present Illness:    Anthony Dodson is a 78 y.o. male regular patient of Dr Bronson Ing, added on today due to recent symptoms of chest pain.    1. CAD - history of multivessel stenting - 12/2015 nuclear stress prior inferoseptal infarct - episode of chest pain while washing dishes. Sharp pain entire chest, 10/10 in severity. +SOB. Sat down on couch. Not positional. Took NG, 10-15 minutes some improvement. Pain lasted about 1 minute, SOB lasted all day into the evening. Yesterday morming mild chest discomfort.  - compliant with meds including coumadin. INR was 3.7    2. History of recurrent PE - on lifelong coumadin     The patient does not have symptoms concerning for COVID-19 infection (fever, chills, cough, or new shortness of breath).    Past Medical History:  Diagnosis  Date  . Anxiety   . CAD (coronary artery disease)    a. 05/2012 Acute Inf/Post MI, Cath/PCI: LM nl, LAD 50p, 2m, 70d, LCX nl, OM1 95ost, OM2 nl, RCA 100ost (2.75x20 Veriflex BMS), PDA 40p, EF 50%.  Residual dzs medically managed.;  b.05/2012 Echo: EF 60-65%, mild LVH;  c. 04/2013 Cath/PCI: LM nl, LAD 30p, 66m(3.0x16 Rebel BMS), 50-74m/d, LCX nl, OM1 90p(2.5x12 Rebel BMS), RCA 20p ISR, EF 55-60.  Marland Kitchen Cancer ([place])    a. 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 GERD (gastroesophageal reflux disease)   . Hypertension   . Prostate cancer ([place])   . Pulmonary emboli ([place])    a. recurrent - last 2012.  Chronic Coumadin  . Smokeless tobacco use    a. chew   Past Surgical History:  Procedure Laterality Date  . CATARACT EXTRACTION W/PHACO Left 03/18/2016   Procedure: CATARACT EXTRACTION PHACO AND INTRAOCULAR LENS PLACEMENT ; CDE:  11.45;  Surgeon: Williams Che, MD;  Location: [facility];  Service: Ophthalmology;  Laterality: Left;  . CHOLECYSTECTOMY    . Head and neck surgery    . INGUINAL HERNIA REPAIR     x 2  . KIDNEY STONE SURGERY    . LEFT HEART CATH Right 05/12/2012   Procedure: LEFT HEART CATH;  Surgeon: Hillary Bow, MD;  Location: [facility] CATH LAB;  Service: Cardiovascular;  Laterality: Right;  . LEFT HEART CATHETERIZATION WITH CORONARY ANGIOGRAM N/A 04/13/2013   Procedure: LEFT HEART CATHETERIZATION WITH CORONARY ANGIOGRAM;  Surgeon: Peter M Martinique, MD;  Location: [place] [facility] CATH LAB;  Service: Cardiovascular;  Laterality: N/A;  . PERCUTANEOUS CORONARY STENT INTERVENTION (PCI-S) Right 05/12/2012   Procedure: PERCUTANEOUS CORONARY STENT INTERVENTION (PCI-S);  Surgeon: Hillary Bow, MD;  Location: [facility] CATH LAB;  Service: Cardiovascular;  Laterality: Right;  . PERCUTANEOUS CORONARY STENT INTERVENTION (PCI-S)  04/13/2013   Procedure: PERCUTANEOUS CORONARY STENT INTERVENTION (PCI-S);  Surgeon: Peter M Martinique, MD;  Location: [facility] CATH LAB;  Service: Cardiovascular;;  BMS  Mid LAD and  prox OM1      Current Meds  Medication Sig  . ALPRAZolam (XANAX) 1 MG tablet Take 1 mg by mouth 3 (three) times daily as needed for anxiety. For anxiety  . atorvastatin (LIPITOR) 80 MG tablet Take 1 tablet by mouth daily.  . Cholecalciferol (VITAMIN D PO) Take 1 tablet by mouth daily.  . cholecalciferol (VITAMIN D3) 25 MCG (1000 UT) tablet Take 1,000 Units by mouth daily.  Marland Kitchen HYDROcodone-acetaminophen (NORCO) 7.5-325 MG tablet Take 1 tablet by mouth every 4 (four) hours as needed.  . metoprolol succinate (TOPROL-XL) 25 MG 24 hr tablet Take 25 mg by mouth 2 (two) times daily.   . nitroGLYCERIN (NITROSTAT) 0.4 MG SL tablet Place 1 tablet (0.4 mg total) under the tongue every 5 (five) minutes x 3 doses as needed for chest pain. NEED OV.  Marland Kitchen omeprazole (PRILOSEC) 20 MG capsule Take 20 mg by mouth daily.  . temazepam (RESTORIL) 15 MG capsule Take 15 mg by mouth at bedtime as needed for sleep.  . vitamin B-12 (CYANOCOBALAMIN) 1000 MCG tablet Take 1,000 mcg by mouth daily.  Marland Kitchen warfarin (COUMADIN) 5 MG tablet Take 5 mg by mouth daily.      Allergies:   Tylenol with codeine #3  [acetaminophen-codeine]   Social History   Tobacco Use  . Smoking status: Never Smoker  . Smokeless tobacco: Current User    Types: Chew  . 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 Alcohol use: No  . Drug use: No     Family Hx: The patient's family history includes Lung disease in his father; Stroke in his mother.  ROS:   Please see the history of present illness.     All other systems reviewed and are negative.   Prior CV studies:   The following studies were reviewed today:   Labs/Other Tests and Data Reviewed:    EKG:  No ECG reviewed.  Recent Labs: No results found for requested labs within last 8760 hours.   Recent Lipid Panel Lab Results  Component Value Date/Time   CHOL 141 05/13/2012 03:11 AM   TRIG 283 (H) 05/13/2012  03:11 AM   HDL 24 (L) 05/13/2012 03:11 AM   CHOLHDL 5.9 05/13/2012 03:11 AM   LDLCALC 60 05/13/2012 03:11 AM    Wt Readings from Last 3 Encounters:  08/10/19 176 lb (79.8 kg)  04/01/19 167 lb (75.8 kg)  09/24/18 173 lb 6.4 oz (78.7 kg)     Objective:    Vital Signs:  BP 138/75   Ht 5\' 9"  (1.753 m)   Wt 176 lb (79.8 kg)   BMI 25.99 kg/m    Normal affect. Normal speech pattern and tone. Comfortable, no apparent distress. No audible signs of SOB or wheezing .  ASSESSMENT & PLAN:    1. CAD - recent episode of chest pain, we will plan on a nulcear stress test to further evaluate  2. History  of PE - on lifelong coumadin. Recent change in coumadin dosing due to high INR. With recent chest pains repeat INR to make sure he was not suddently subtherapeutic.    COVID-19 Education: The signs and symptoms of COVID-19 were discussed with the patient and how to seek care for testing (follow up with PCP or arrange E-visit).  The importance of social distancing was discussed today.  Time:   Today, I have spent 20 minutes with the patient with telehealth technology discussing the above problems.     Medication Adjustments/Labs and Tests Ordered: Current medicines are reviewed at length with the patient today.  Concerns regarding medicines are outlined above.   Tests Ordered: No orders of the defined types were placed in this encounter.   Medication Changes: No orders of the defined types were placed in this encounter.   Follow Up:  Pending stress results  Signed, Carlyle Dolly, MD  08/10/2019 2:19 PM    [facility]

## 2019-08-10 NOTE — Patient Instructions (Addendum)
Medication Instructions:  Your physician recommends that you continue on your current medications as directed. Please refer to the Current Medication list given to you today.  *If you need a refill on your cardiac medications before your next appointment, please call your pharmacy*  Lab Work:  INR this week at [facility] done on Thursday after your lexiscan.Lab ordered will be faxed to [facility]  If you have labs (blood work) drawn today and your tests are completely normal, you will receive your results only by: Marland Kitchen MyChart Message (if you have MyChart) OR . A paper copy in the mail If you have any lab test that is abnormal or we need to change your treatment, we will call you to review the results.  Testing/Procedures: Your physician has requested that you have a lexiscan myoview. For further information please visit [email or web address]. Please follow instruction sheet, as given. I spoke with patient on phone and gave test instructions.    Follow-Up: At [facility], you and your health needs are our priority.  As part of our continuing mission to provide you with exceptional heart care, we have created designated Provider Care Teams.  These Care Teams include your primary Cardiologist (physician) and Advanced Practice Providers (APPs -  Physician Assistants and Nurse Practitioners) who all work together to provide you with the care you need, when you need it.   Follow up to be determined after Lexiscan. We will call you with results.      Thank you for choosing [facility] !

## 2019-08-10 NOTE — Telephone Encounter (Signed)
  Precert needed for:  lexiscan   Location: [place]    Date: Aug 12, 2019

## 2019-08-10 NOTE — Telephone Encounter (Signed)
The patient verbally consented for a telehealth phone visit with [facility] and understands that his/her insurance company will be billed for the encounter.  

## 2019-08-12 ENCOUNTER — Other Ambulatory Visit: Payer: Self-pay

## 2019-08-12 ENCOUNTER — Encounter (HOSPITAL_COMMUNITY): Payer: Self-pay

## 2019-08-12 ENCOUNTER — Encounter (HOSPITAL_COMMUNITY)
Admission: RE | Admit: 2019-08-12 | Discharge: 2019-08-12 | Disposition: A | Discharge status: Home / Self Care | Payer: Medicare Other | Source: Ambulatory Visit | Attending: Cardiology | Admitting: Cardiology

## 2019-08-12 ENCOUNTER — Encounter (HOSPITAL_BASED_OUTPATIENT_CLINIC_OR_DEPARTMENT_OTHER)
Admission: RE | Admit: 2019-08-12 | Discharge: 2019-08-12 | Disposition: A | Discharge status: Home / Self Care | Payer: Medicare Other | Source: Ambulatory Visit | Attending: Cardiology | Admitting: Cardiology

## 2019-08-12 ENCOUNTER — Other Ambulatory Visit (HOSPITAL_COMMUNITY)
Admission: RE | Admit: 2019-08-12 | Discharge: 2019-08-12 | Disposition: A | Discharge status: Home / Self Care | Payer: Medicare Other | Source: Ambulatory Visit | Attending: Cardiology | Admitting: Cardiology

## 2019-08-12 DIAGNOSIS — R079 Chest pain, unspecified: Secondary | ICD-10-CM

## 2019-08-12 LAB — NM MYOCAR MULTI W/SPECT W/WALL MOTION / EF
LV dias vol: 77 mL (ref 62–150)
LV sys vol: 29 mL
Peak HR: 82 {beats}/min
RATE: 0.29
Rest HR: 58 {beats}/min
SDS: 2
SRS: 6
SSS: 8
TID: 1.06

## 2019-08-12 LAB — PROTIME-INR
INR: 2.4 — ABNORMAL HIGH (ref 0.8–1.2)
Prothrombin Time: 26.3 seconds — ABNORMAL HIGH (ref 11.4–15.2)

## 2019-08-12 MED ORDER — TECHNETIUM TC 99M TETROFOSMIN IV KIT
30.0000 | PACK | Freq: Once | INTRAVENOUS | Status: AC | PRN
  Administered 2019-08-12: 33 via INTRAVENOUS

## 2019-08-12 MED ORDER — TECHNETIUM TC 99M TETROFOSMIN IV KIT
10.0000 | PACK | Freq: Once | INTRAVENOUS | Status: AC | PRN
  Administered 2019-08-12: 11 via INTRAVENOUS

## 2019-08-12 MED ORDER — SODIUM CHLORIDE FLUSH 0.9 % IV SOLN
INTRAVENOUS | Status: AC
  Administered 2019-08-12: 10 mL via INTRAVENOUS
  Filled 2019-08-12: qty 10

## 2019-08-12 MED ORDER — REGADENOSON 0.4 MG/5ML IV SOLN
INTRAVENOUS | Status: AC
  Administered 2019-08-12: 0.4 mg via INTRAVENOUS
  Filled 2019-08-12: qty 5

## 2019-08-19 ENCOUNTER — Telehealth: Payer: Self-pay | Admitting: Cardiology

## 2019-08-19 NOTE — Telephone Encounter (Signed)
Virtual Visit Pre-Appointment Phone Call  "(Name), I am calling you today to discuss your upcoming appointment. We are currently trying to limit exposure to the virus that causes COVID-19 by seeing patients at home rather than in the office."  "What is the BEST phone number to call the day of the visit?" -  [phone]   1. Do you have or have access to (through a family member/friend) a smartphone with video capability that we can use for your visit?" a. If yes - list this number in appt notes as cell (if different from BEST phone #) and list the appointment type as a VIDEO visit in appointment notes b. If no - list the appointment type as a PHONE visit in appointment notes  2. Confirm consent - "In the setting of the current Covid19 crisis, you are scheduled for a (phone or video) visit with your provider on (date) at (time).  Just as we do with many in-office visits, in order for you to participate in this visit, we must obtain consent.  If you'd like, I can send this to your mychart (if signed up) or email for you to review.  Otherwise, I can obtain your verbal consent now.  All virtual visits are billed to your insurance company just like a normal visit would be.  By agreeing to a virtual visit, we'd like you to understand that the technology does not allow for your provider to perform an examination, and thus may limit your provider's ability to fully assess your condition. If your provider identifies any concerns that need to be evaluated in person, we will make arrangements to do so.  Finally, though the technology is pretty good, we cannot assure that it will always work on either your or our end, and in the setting of a video visit, we may have to convert it to a phone-only visit.  In either situation, we cannot ensure that we have a secure connection.  Are you willing to proceed?" STAFF: Did the patient verbally acknowledge consent to telehealth visit? Document YES/NO here: YES    3. Advise patient to be prepared - "Two hours prior to your appointment, go ahead and check your blood pressure, pulse, oxygen saturation, and your weight (if you have the equipment to check those) and write them all down. When your visit starts, your provider will ask you for this information. If you have an Apple Watch or Kardia device, please plan to have heart rate information ready on the day of your appointment. Please have a pen and paper handy nearby the day of the visit as well."  4. Give patient instructions for MyChart download to smartphone OR Doximity/Doxy.me as below if video visit (depending on what platform provider is using)  5. Inform patient they will receive a phone call 15 minutes prior to their appointment time (may be from unknown caller ID) so they should be prepared to answer    TELEPHONE CALL NOTE  Anthony Dodson has been deemed a candidate for a follow-up tele-health visit to limit community exposure during the Covid-19 pandemic. I spoke with the patient via phone to ensure availability of phone/video source, confirm preferred email & phone number, and discuss instructions and expectations.  I reminded Anthony Dodson to be prepared with any vital sign and/or heart rhythm information that could potentially be obtained via home monitoring, at the time of his visit. I reminded Anthony Dodson to expect a phone call prior to his visit.  Anthony Dodson 08/19/2019 1:35 PM   INSTRUCTIONS FOR DOWNLOADING THE MYCHART APP TO SMARTPHONE  - The patient must first make sure to have activated MyChart and know their login information - If Apple, go to [employer] and type in MyChart in the search bar and download the app. If Android, ask patient to go to [employer] and type in [place] in the search bar and download the app. The app is free but as with any other app downloads, their phone may require them to verify saved payment information or Apple/Android password.  -  The patient will need to then log into the app with their MyChart username and password, and select [facility] as their healthcare provider to link the account. When it is time for your visit, go to the MyChart app, find appointments, and click Begin Video Visit. Be sure to Select Allow for your device to access the Microphone and Camera for your visit. You will then be connected, and your provider will be with you shortly.  **If they have any issues connecting, or need assistance please contact MyChart service desk [phone])**  **If using a computer, in order to ensure the best quality for their visit they will need to use either of the following Internet Browsers: [employer], or Google Chrome**  IF USING DOXIMITY or DOXY.ME - The patient will receive a link just prior to their visit by text.     FULL LENGTH CONSENT FOR TELE-HEALTH VISIT   I hereby voluntarily request, consent and authorize [facility] and its employed or contracted physicians, physician assistants, nurse practitioners or other licensed health care professionals (the Practitioner), to provide me with telemedicine health care services (the Services") as deemed necessary by the treating Practitioner. I acknowledge and consent to receive the Services by the Practitioner via telemedicine. I understand that the telemedicine visit will involve communicating with the Practitioner through live audiovisual communication technology and the disclosure of certain medical information by electronic transmission. I acknowledge that I have been given the opportunity to request an in-person assessment or other available alternative prior to the telemedicine visit and am voluntarily participating in the telemedicine visit.  I understand that I have the right to withhold or withdraw my consent to the use of telemedicine in the course of my care at any time, without affecting my right to future care or treatment, and that the  Practitioner or I may terminate the telemedicine visit at any time. I understand that I have the right to inspect all information obtained and/or recorded in the course of the telemedicine visit and may receive copies of available information for a reasonable fee.  I understand that some of the potential risks of receiving the Services via telemedicine include:   Delay or interruption in medical evaluation due to technological equipment failure or disruption;  Information transmitted may not be sufficient (e.g. poor resolution of images) to allow for appropriate medical decision making by the Practitioner; and/or   In rare instances, security protocols could fail, causing a breach of personal health information.  Furthermore, I acknowledge that it is my responsibility to provide information about my medical history, conditions and care that is complete and accurate to the best of my ability. I acknowledge that Practitioner's advice, recommendations, and/or decision may be based on factors not within their control, such as incomplete or inaccurate data provided by me or distortions of diagnostic images or specimens that may result from electronic transmissions. I understand that the  practice of medicine is not an Chief Strategy Officer and that Practitioner makes no warranties or guarantees regarding treatment outcomes. I acknowledge that I will receive a copy of this consent concurrently upon execution via email to the email address I last provided but may also request a printed copy by calling the office of [facility].    I understand that my insurance will be billed for this visit.   I have read or had this consent read to me.  I understand the contents of this consent, which adequately explains the benefits and risks of the Services being provided via telemedicine.   I have been provided ample opportunity to ask questions regarding this consent and the Services and have had my questions answered to my  satisfaction.  I give my informed consent for the services to be provided through the use of telemedicine in my medical care  By participating in this telemedicine visit I agree to the above.

## 2019-08-20 ENCOUNTER — Encounter: Payer: Self-pay | Admitting: *Deleted

## 2019-08-20 ENCOUNTER — Other Ambulatory Visit: Payer: Self-pay | Admitting: Cardiovascular Disease

## 2019-08-20 ENCOUNTER — Encounter: Payer: Self-pay | Admitting: Cardiovascular Disease

## 2019-08-20 ENCOUNTER — Telehealth (INDEPENDENT_AMBULATORY_CARE_PROVIDER_SITE_OTHER): Payer: Medicare Other | Admitting: Cardiovascular Disease

## 2019-08-20 ENCOUNTER — Telehealth: Payer: Self-pay | Admitting: Cardiovascular Disease

## 2019-08-20 VITALS — BP 137/79 | Ht 69.0 in | Wt 164.0 lb

## 2019-08-20 DIAGNOSIS — Z5181 Encounter for therapeutic drug level monitoring: Secondary | ICD-10-CM

## 2019-08-20 DIAGNOSIS — Z7901 Long term (current) use of anticoagulants: Secondary | ICD-10-CM

## 2019-08-20 DIAGNOSIS — I2782 Chronic pulmonary embolism: Secondary | ICD-10-CM

## 2019-08-20 DIAGNOSIS — I25118 Atherosclerotic heart disease of native coronary artery with other forms of angina pectoris: Secondary | ICD-10-CM | POA: Diagnosis not present

## 2019-08-20 DIAGNOSIS — Z955 Presence of coronary angioplasty implant and graft: Secondary | ICD-10-CM

## 2019-08-20 DIAGNOSIS — I452 Bifascicular block: Secondary | ICD-10-CM

## 2019-08-20 MED ORDER — SODIUM CHLORIDE 0.9% FLUSH: 3.0000 mL | Freq: Two times a day (BID) | INTRAVENOUS | Status: AC

## 2019-08-20 NOTE — H&P (View-Only) (Signed)
Virtual Visit via Telephone Note   This visit type was conducted due to national recommendations for restrictions regarding the COVID-19 Pandemic (e.g. social distancing) in an effort to limit this patient's exposure and mitigate transmission in our community.  Due to his co-morbid illnesses, this patient is at least at moderate risk for complications without adequate follow up.  This format is felt to be most appropriate for this patient at this time.  The patient did not have access to video technology/had technical difficulties with video requiring transitioning to audio format only (telephone).  All issues noted in this document were discussed and addressed.  No physical exam could be performed with this format.  Please refer to the patient's chart for his  consent to telehealth for [facility].   Date:  08/20/2019   ID:  Anthony Dodson, DOB [birth year], MRN [id number]  Patient Location: Home Provider Location: Home  PCP:  System, Provider Not In  Cardiologist:  Kate Sable, MD  Electrophysiologist:  None   Evaluation Performed:  Follow-Up Visit  Chief Complaint:  CAD, chest pain  History of Present Illness:    Anthony Dodson is a 79 y.o. male with coronary artery disease and chest pain.  He recently had a telehealth visit with Dr. Harl Bowie on 08/10/2019 due to recent complaints of chest pain.  Nuclear stress test on 08/12/2019 showed large prior inferior myocardial infarction with moderate peri-infarct ischemia.  It was deemed an intermediate risk study.  LVEF 55 to 65%.  In summary, he has a history of multivessel coronary stenting. Last coronary angiogram was on 04/13/2013. He has a prior history of inferior MI in 2013 with stenting of the proximal RCA at that time. In September 2014 he underwent successful stenting of the mid LAD and first obtuse marginal with bare-metal stents.  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 He  has a history of prostate cancer as well.   He had another episode of chest discomfort about 3 nights ago which did not require nitro.  He's had issues with BP. He's under a lot of stress taking care of his handicapped wife who has a colostomy bag.   Social history: His first wife died in 55.  He has been remarried since 2013.  He and his first wife fostered 16 children, 13 at 1 time!  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  He golfed a round with Research officer, trade union, [employer], and [employer]. He has a strong faith.  Past Medical History:  Diagnosis Date  . Anxiety   . CAD (coronary artery disease)    a. 05/2012 Acute Inf/Post MI, Cath/PCI: LM nl, LAD 50p, 53m, 70d, LCX nl, OM1 95ost, OM2 nl, RCA 100ost (2.75x20 Veriflex BMS), PDA 40p, EF 50%.  Residual dzs medically managed.;  b.05/2012 Echo: EF 60-65%, mild LVH;  c. 04/2013 Cath/PCI: LM nl, LAD 30p, 52m(3.0x16 Rebel BMS), 50-65m/d, LCX nl, OM1 90p(2.5x12 Rebel BMS), RCA 20p ISR, EF 55-60.  Marland Kitchen Cancer ([place])    a. 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 GERD (gastroesophageal reflux disease)   . Hypertension   . Prostate cancer ([place])   . Pulmonary emboli ([place])    a. recurrent - last 2012.  Chronic Coumadin  . Smokeless tobacco use    a. chew   Past Surgical History:  Procedure Laterality Date  . CATARACT EXTRACTION W/PHACO Left 03/18/2016   Procedure: CATARACT EXTRACTION PHACO  AND INTRAOCULAR LENS PLACEMENT ; CDE:  11.45;  Surgeon: Williams Che, MD;  Location: [facility];  Service: Ophthalmology;  Laterality: Left;  . CHOLECYSTECTOMY    . Head and neck surgery    . INGUINAL HERNIA REPAIR     x 2  . KIDNEY STONE SURGERY    . LEFT HEART CATH Right 05/12/2012   Procedure: LEFT HEART CATH;  Surgeon: Hillary Bow, MD;  Location: [facility] CATH LAB;  Service: Cardiovascular;  Laterality: Right;  . LEFT HEART CATHETERIZATION WITH CORONARY ANGIOGRAM N/A 04/13/2013   Procedure: LEFT HEART CATHETERIZATION WITH CORONARY  ANGIOGRAM;  Surgeon: Peter M Martinique, MD;  Location: [facility] CATH LAB;  Service: Cardiovascular;  Laterality: N/A;  . PERCUTANEOUS CORONARY STENT INTERVENTION (PCI-S) Right 05/12/2012   Procedure: PERCUTANEOUS CORONARY STENT INTERVENTION (PCI-S);  Surgeon: Hillary Bow, MD;  Location: [facility] CATH LAB;  Service: Cardiovascular;  Laterality: Right;  . PERCUTANEOUS CORONARY STENT INTERVENTION (PCI-S)  04/13/2013   Procedure: PERCUTANEOUS CORONARY STENT INTERVENTION (PCI-S);  Surgeon: Peter M Martinique, MD;  Location: [facility] CATH LAB;  Service: Cardiovascular;;  BMS  Mid LAD and  prox OM1     Current Meds  Medication Sig  . ALPRAZolam (XANAX) 1 MG tablet Take 1 mg by mouth 3 (three) times daily as needed for anxiety. For anxiety  . atorvastatin (LIPITOR) 80 MG tablet Take 1 tablet by mouth daily.  . Cholecalciferol (VITAMIN D PO) Take 1 tablet by mouth daily.  . cholecalciferol (VITAMIN D3) 25 MCG (1000 UT) tablet Take 1,000 Units by mouth daily.  Marland Kitchen CRANBERRY EXTRACT PO Take by mouth.  Marland Kitchen HYDROcodone-acetaminophen (NORCO) 7.5-325 MG tablet Take 1 tablet by mouth every 4 (four) hours as needed.  . metoprolol succinate (TOPROL-XL) 25 MG 24 hr tablet Take 25 mg by mouth 2 (two) times daily.   . nitroGLYCERIN (NITROSTAT) 0.4 MG SL tablet Place 1 tablet (0.4 mg total) under the tongue every 5 (five) minutes x 3 doses as needed for chest pain. NEED OV.  Marland Kitchen omeprazole (PRILOSEC) 20 MG capsule Take 20 mg by mouth daily.  . temazepam (RESTORIL) 15 MG capsule Take 15 mg by mouth at bedtime as needed for sleep.  . vitamin B-12 (CYANOCOBALAMIN) 1000 MCG tablet Take 1,000 mcg by mouth daily.  Marland Kitchen warfarin (COUMADIN) 5 MG tablet Take 5 mg by mouth daily.      Allergies:   Tylenol with codeine #3  [acetaminophen-codeine]   Social History   Tobacco Use  . Smoking status: Never Smoker  . Smokeless tobacco: Current User    Types: Chew  . 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 Alcohol use: No  . Drug use: No     Family Hx: The patient's family history includes Lung disease in his father; Stroke in his mother.  ROS:   Please see the history of present illness.     All other systems reviewed and are negative.   Prior CV studies:   The following studies were reviewed today:  Nuclear stress test 08/12/19:   There was no ST segment deviation noted during stress.  Findings consistent with large prior inferior myocardial infarction with moderate peri-infarct ischemia.  This is an intermediate risk study.  The left ventricular ejection fraction is normal (55-65%).     Labs/Other Tests and Data Reviewed:    EKG:  No ECG reviewed.  Recent Labs: No results found for  requested labs within last 8760 hours.   Recent Lipid Panel Lab Results  Component Value Date/Time   CHOL 141 05/13/2012 03:11 AM   TRIG 283 (H) 05/13/2012 03:11 AM   HDL 24 (L) 05/13/2012 03:11 AM   CHOLHDL 5.9 05/13/2012 03:11 AM   LDLCALC 60 05/13/2012 03:11 AM    Wt Readings from Last 3 Encounters:  08/20/19 164 lb (74.4 kg)  08/10/19 176 lb (79.8 kg)  04/01/19 167 lb (75.8 kg)     Objective:    Vital Signs:  BP 137/79   Ht 5\' 9"  (1.753 m)   Wt 164 lb (74.4 kg)   BMI 24.22 kg/m    VITAL SIGNS:  reviewed  ASSESSMENT & PLAN:    1. Coronary artery disease: He has a history of multivessel PCI with details noted above.  He has been experiencing chest pain and nuclear stress test showed moderate peri-infarct ischemia. Continue Toprol-XL 25 mg twice daily and atorvastatin 80 mg. No ASA as he is on warfarin. We talked about starting long-acting nitrates. He prefers to proceed with cardiac catheterization which I will arrange. Risks and benefits of cardiac catheterization have been discussed with the patient.  These include bleeding, infection, kidney damage, stroke, heart attack, death.  The patient understands these risks and is willing to  proceed.   2. Recurrent pulmonary emboli: He is on chronic warfarin therapy and followed in our anticoagulation clinic.  INR 2.4 on 08/12/19.  3. 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  4. Hypertension: BP is normal. He is under a lot of stress.   COVID-19 Education: The signs and symptoms of COVID-19 were discussed with the patient and how to seek care for testing (follow up with PCP or arrange E-visit).  The importance of social distancing was discussed today.  Time:   Today, I have spent 25 minutes with the patient with telehealth technology discussing the above problems.    A high level of decision making was required for increased medical complexities.  Medication Adjustments/Labs and Tests Ordered: Current medicines are reviewed at length with the patient today.  Concerns regarding medicines are outlined above.   Tests Ordered: No orders of the defined types were placed in this encounter.   Medication Changes: No orders of the defined types were placed in this encounter.   Follow Up:  Virtual Visit  in 2 month(s)  Signed, Kate Sable, MD  08/20/2019 9:56 AM    [place]

## 2019-08-20 NOTE — Progress Notes (Signed)
Virtual Visit via Telephone Note   This visit type was conducted due to national recommendations for restrictions regarding the COVID-19 Pandemic (e.g. social distancing) in an effort to limit this patient's exposure and mitigate transmission in our community.  Due to his co-morbid illnesses, this patient is at least at moderate risk for complications without adequate follow up.  This format is felt to be most appropriate for this patient at this time.  The patient did not have access to video technology/had technical difficulties with video requiring transitioning to audio format only (telephone).  All issues noted in this document were discussed and addressed.  No physical exam could be performed with this format.  Please refer to the patient's chart for his  consent to telehealth for [facility].   Date:  08/20/2019   ID:  Anthony Dodson, DOB [birth year], MRN [id number]  Patient Location: Home Provider Location: Home  PCP:  System, Provider Not In  Cardiologist:  Kate Sable, MD  Electrophysiologist:  None   Evaluation Performed:  Follow-Up Visit  Chief Complaint:  CAD, chest pain  History of Present Illness:    Anthony Dodson is a 79 y.o. male with coronary artery disease and chest pain.  He recently had a telehealth visit with Dr. Harl Bowie on 08/10/2019 due to recent complaints of chest pain.  Nuclear stress test on 08/12/2019 showed large prior inferior myocardial infarction with moderate peri-infarct ischemia.  It was deemed an intermediate risk study.  LVEF 55 to 65%.  In summary, he has a history of multivessel coronary stenting. Last coronary angiogram was on 04/13/2013. He has a prior history of inferior MI in 2013 with stenting of the proximal RCA at that time. In September 2014 he underwent successful stenting of the mid LAD and first obtuse marginal with bare-metal stents.  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 He  has a history of prostate cancer as well.   He had another episode of chest discomfort about 3 nights ago which did not require nitro.  He's had issues with BP. He's under a lot of stress taking care of his handicapped wife who has a colostomy bag.   Social history: His first wife died in 62.  He has been remarried since 2013.  He and his first wife fostered 26 children, 13 at 1 time!  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  He golfed a round with Research officer, trade union, [employer], and [employer]. He has a strong faith.  Past Medical History:  Diagnosis Date  . Anxiety   . CAD (coronary artery disease)    a. 05/2012 Acute Inf/Post MI, Cath/PCI: LM nl, LAD 50p, 77m, 70d, LCX nl, OM1 95ost, OM2 nl, RCA 100ost (2.75x20 Veriflex BMS), PDA 40p, EF 50%.  Residual dzs medically managed.;  b.05/2012 Echo: EF 60-65%, mild LVH;  c. 04/2013 Cath/PCI: LM nl, LAD 30p, 73m(3.0x16 Rebel BMS), 50-60m/d, LCX nl, OM1 90p(2.5x12 Rebel BMS), RCA 20p ISR, EF 55-60.  Marland Kitchen Cancer ([place])    a. 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 GERD (gastroesophageal reflux disease)   . Hypertension   . Prostate cancer ([place])   . Pulmonary emboli ([place])    a. recurrent - last 2012.  Chronic Coumadin  . Smokeless tobacco use    a. chew   Past Surgical History:  Procedure Laterality Date  . CATARACT EXTRACTION W/PHACO Left 03/18/2016   Procedure: CATARACT EXTRACTION PHACO  AND INTRAOCULAR LENS PLACEMENT ; CDE:  11.45;  Surgeon: Williams Che, MD;  Location: [facility];  Service: Ophthalmology;  Laterality: Left;  . CHOLECYSTECTOMY    . Head and neck surgery    . INGUINAL HERNIA REPAIR     x 2  . KIDNEY STONE SURGERY    . LEFT HEART CATH Right 05/12/2012   Procedure: LEFT HEART CATH;  Surgeon: Hillary Bow, MD;  Location[facility] CATH LAB;  Service: Cardiovascular;  Laterality: Right;  . LEFT HEART CATHETERIZATION WITH CORONARY ANGIOGRAM N/A 04/13/2013   Procedure: LEFT HEART CATHETERIZATION WITH CORONARY  ANGIOGRAM;  Surgeon: Peter M Martinique, MD;  Location[facility] CATH LAB;  Service: Cardiovascular;  Laterality: N/A;  . PERCUTANEOUS CORONARY STENT INTERVENTION (PCI-S) Right 05/12/2012   Procedure: PERCUTANEOUS CORONARY STENT INTERVENTION (PCI-S);  Surgeon: Hillary Bow, MD;  Location: [facility] CATH LAB;  Service: Cardiovascular;  Laterality: Right;  . PERCUTANEOUS CORONARY STENT INTERVENTION (PCI-S)  04/13/2013   Procedure: PERCUTANEOUS CORONARY STENT INTERVENTION (PCI-S);  Surgeon: Peter M Martinique, MD;  Location: [facility] CATH LAB;  Service: Cardiovascular;;  BMS  Mid LAD and  prox OM1     Current Meds  Medication Sig  . ALPRAZolam (XANAX) 1 MG tablet Take 1 mg by mouth 3 (three) times daily as needed for anxiety. For anxiety  . atorvastatin (LIPITOR) 80 MG tablet Take 1 tablet by mouth daily.  . Cholecalciferol (VITAMIN D PO) Take 1 tablet by mouth daily.  . cholecalciferol (VITAMIN D3) 25 MCG (1000 UT) tablet Take 1,000 Units by mouth daily.  Marland Kitchen CRANBERRY EXTRACT PO Take by mouth.  Marland Kitchen HYDROcodone-acetaminophen (NORCO) 7.5-325 MG tablet Take 1 tablet by mouth every 4 (four) hours as needed.  . metoprolol succinate (TOPROL-XL) 25 MG 24 hr tablet Take 25 mg by mouth 2 (two) times daily.   . nitroGLYCERIN (NITROSTAT) 0.4 MG SL tablet Place 1 tablet (0.4 mg total) under the tongue every 5 (five) minutes x 3 doses as needed for chest pain. NEED OV.  Marland Kitchen omeprazole (PRILOSEC) 20 MG capsule Take 20 mg by mouth daily.  . temazepam (RESTORIL) 15 MG capsule Take 15 mg by mouth at bedtime as needed for sleep.  . vitamin B-12 (CYANOCOBALAMIN) 1000 MCG tablet Take 1,000 mcg by mouth daily.  Marland Kitchen warfarin (COUMADIN) 5 MG tablet Take 5 mg by mouth daily.      Allergies:   Tylenol with codeine #3  [acetaminophen-codeine]   Social History   Tobacco Use  . Smoking status: Never Smoker  . Smokeless tobacco: Current User    Types: Chew  . 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 Alcohol use: No  . Drug use: No     Family Hx: The patient's family history includes Lung disease in his father; Stroke in his mother.  ROS:   Please see the history of present illness.     All other systems reviewed and are negative.   Prior CV studies:   The following studies were reviewed today:  Nuclear stress test 08/12/19:   There was no ST segment deviation noted during stress.  Findings consistent with large prior inferior myocardial infarction with moderate peri-infarct ischemia.  This is an intermediate risk study.  The left ventricular ejection fraction is normal (55-65%).     Labs/Other Tests and Data Reviewed:    EKG:  No ECG reviewed.  Recent Labs: No results found for  requested labs within last 8760 hours.   Recent Lipid Panel Lab Results  Component Value Date/Time   CHOL 141 05/13/2012 03:11 AM   TRIG 283 (H) 05/13/2012 03:11 AM   HDL 24 (L) 05/13/2012 03:11 AM   CHOLHDL 5.9 05/13/2012 03:11 AM   LDLCALC 60 05/13/2012 03:11 AM    Wt Readings from Last 3 Encounters:  08/20/19 164 lb (74.4 kg)  08/10/19 176 lb (79.8 kg)  04/01/19 167 lb (75.8 kg)     Objective:    Vital Signs:  BP 137/79   Ht 5\' 9"  (1.753 m)   Wt 164 lb (74.4 kg)   BMI 24.22 kg/m    VITAL SIGNS:  reviewed  ASSESSMENT & PLAN:    1. Coronary artery disease: He has a history of multivessel PCI with details noted above.  He has been experiencing chest pain and nuclear stress test showed moderate peri-infarct ischemia. Continue Toprol-XL 25 mg twice daily and atorvastatin 80 mg. No ASA as he is on warfarin. We talked about starting long-acting nitrates. He prefers to proceed with cardiac catheterization which I will arrange. Risks and benefits of cardiac catheterization have been discussed with the patient.  These include bleeding, infection, kidney damage, stroke, heart attack, death.  The patient understands these risks and is willing to  proceed.   2. Recurrent pulmonary emboli: He is on chronic warfarin therapy and followed in our anticoagulation clinic.  INR 2.4 on 08/12/19.  3. 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  4. Hypertension: BP is normal. He is under a lot of stress.   COVID-19 Education: The signs and symptoms of COVID-19 were discussed with the patient and how to seek care for testing (follow up with PCP or arrange E-visit).  The importance of social distancing was discussed today.  Time:   Today, I have spent 25 minutes with the patient with telehealth technology discussing the above problems.    A high level of decision making was required for increased medical complexities.  Medication Adjustments/Labs and Tests Ordered: Current medicines are reviewed at length with the patient today.  Concerns regarding medicines are outlined above.   Tests Ordered: No orders of the defined types were placed in this encounter.   Medication Changes: No orders of the defined types were placed in this encounter.   Follow Up:  Virtual Visit  in 2 month(s)  Signed, Kate Sable, MD  08/20/2019 9:56 AM    [place]

## 2019-08-20 NOTE — Telephone Encounter (Signed)
Pre-Cert for [facility] [id number] ABN STRESS TEST

## 2019-08-20 NOTE — Addendum Note (Signed)
Addended by: Julian Hy T on: 08/20/2019 02:38 PM   Modules accepted: Orders

## 2019-08-20 NOTE — Patient Instructions (Signed)
Your physician recommends that you schedule a follow-up appointment in: [place]  Your physician recommends that you continue on your current medications as directed. Please refer to the Current Medication list given to you today.  Your physician has requested that you have a cardiac catheterization. Cardiac catheterization is used to diagnose and/or treat various heart conditions. Doctors may recommend this procedure for a number of different reasons. The most common reason is to evaluate chest pain. Chest pain can be a symptom of coronary artery disease (CAD), and cardiac catheterization can show whether plaque is narrowing or blocking your heart's arteries. This procedure is also used to evaluate the valves, as well as measure the blood flow and oxygen levels in different parts of your heart. For further information please visit [email or web address]. Please follow instruction sheet, as given.  Thank you for choosing [facility]!!

## 2019-08-20 NOTE — Addendum Note (Signed)
Addended by: Julian Hy T on: 08/20/2019 10:44 AM   Modules accepted: Orders

## 2019-08-23 ENCOUNTER — Telehealth: Payer: Self-pay | Admitting: Cardiovascular Disease

## 2019-08-23 NOTE — Telephone Encounter (Signed)
Patient called stating that he wanted to get test results but uncertain as to what test.

## 2019-08-23 NOTE — Telephone Encounter (Signed)
Pt was confused about stress test results - aware that we discussed stress test results on Friday telehealth and this was why cath was scheduled - aware to have COVID test and labs done tomorrow - pt voiced understanding

## 2019-08-24 ENCOUNTER — Ambulatory Visit: Payer: Medicare Other | Admitting: Cardiovascular Disease

## 2019-08-24 ENCOUNTER — Other Ambulatory Visit: Payer: Self-pay

## 2019-08-24 ENCOUNTER — Other Ambulatory Visit (HOSPITAL_COMMUNITY)
Admission: RE | Admit: 2019-08-24 | Discharge: 2019-08-24 | Disposition: A | Discharge status: Home / Self Care | Payer: Medicare Other | Source: Ambulatory Visit | Attending: Cardiovascular Disease | Admitting: Cardiovascular Disease

## 2019-08-24 ENCOUNTER — Telehealth: Payer: Self-pay | Admitting: *Deleted

## 2019-08-24 DIAGNOSIS — Z01812 Encounter for preprocedural laboratory examination: Secondary | ICD-10-CM | POA: Diagnosis present

## 2019-08-24 DIAGNOSIS — Z20822 Contact with and (suspected) exposure to covid-19: Secondary | ICD-10-CM | POA: Insufficient documentation

## 2019-08-24 LAB — CBC
HCT: 37.2 % — ABNORMAL LOW (ref 39.0–52.0)
Hemoglobin: 11.9 g/dL — ABNORMAL LOW (ref 13.0–17.0)
MCH: 30.6 pg (ref 26.0–34.0)
MCHC: 32 g/dL (ref 30.0–36.0)
MCV: 95.6 fL (ref 80.0–100.0)
Platelets: 173 10*3/uL (ref 150–400)
RBC: 3.89 MIL/uL — ABNORMAL LOW (ref 4.22–5.81)
RDW: 14.6 % (ref 11.5–15.5)
WBC: 5.1 10*3/uL (ref 4.0–10.5)
nRBC: 0 % (ref 0.0–0.2)

## 2019-08-24 LAB — BASIC METABOLIC PANEL
Anion gap: 6 (ref 5–15)
BUN: 13 mg/dL (ref 8–23)
CO2: 26 mmol/L (ref 22–32)
Calcium: 9.1 mg/dL (ref 8.9–10.3)
Chloride: 107 mmol/L (ref 98–111)
Creatinine, Ser: 1.07 mg/dL (ref 0.61–1.24)
GFR calc Af Amer: >60 mL/min (ref >60)
GFR calc non Af Amer: >60 mL/min (ref >60)
Glucose, Bld: 104 mg/dL — ABNORMAL HIGH (ref 70–99)
Potassium: 3.9 mmol/L (ref 3.5–5.1)
Sodium: 139 mmol/L (ref 135–145)

## 2019-08-24 LAB — PROTIME-INR
INR: 2.5 — ABNORMAL HIGH (ref 0.8–1.2)
Prothrombin Time: 26.7 seconds — ABNORMAL HIGH (ref 11.4–15.2)

## 2019-08-24 LAB — SARS CORONAVIRUS 2 (TAT 6-24 HRS): SARS Coronavirus 2: NEGATIVE

## 2019-08-24 NOTE — Telephone Encounter (Signed)
Pt was made aware on 08/23/2019 and voice understanding

## 2019-08-24 NOTE — Telephone Encounter (Signed)
-----   Message from Malen Gauze, RN sent at 08/23/2019 10:33 AM EST ----- Yes ----- Message ----- From: Massie Maroon, CMA Sent: 08/23/2019   9:15 AM EST To: Malen Gauze, RN  His cath is Thursday is he ok to have that done if I tell him to hold it today through Thursday? ----- Message ----- From: Malen Gauze, RN Sent: 08/23/2019   8:34 AM EST To: Massie Maroon, CMA  5 days ----- Message ----- From: Massie Maroon, CMA Sent: 08/20/2019   2:40 PM EST To: Malen Gauze, RN  Pt is scheduled for [facility] and is going to get labs and COVID test Tuesday, I didn't know how long her needed to hold his coumadin, thanks for your help  [facility]

## 2019-08-25 ENCOUNTER — Telehealth: Payer: Self-pay | Admitting: Cardiovascular Disease

## 2019-08-25 NOTE — Telephone Encounter (Signed)
Please give pt a call-- left message at [facility] office wanting to know what he's supposed to do tomorrow at [facility]

## 2019-08-25 NOTE — Telephone Encounter (Signed)
Heart cath instructions reviewed with patient Verbalized understanding of plan.

## 2019-08-25 NOTE — Telephone Encounter (Signed)
I called patient to review procedure instructions, unable to complete phone call, received message that party does not accept anonymous calls.

## 2019-08-25 NOTE — Telephone Encounter (Addendum)
Pt contacted pre-catheterization scheduled at [facility] for: Thursday August 26, 2019 7:30 AM Verified arrival time and place: [facility]) at: 5:30 AM   No solid food after midnight prior to cath, clear liquids until 5 AM day of procedure. Contrast allergy: no  Hold: Coumadin-08/23/19 until post procedure  Except hold medications AM meds can be  taken pre-cath with sip of water including: ASA 81 mg   Confirmed patient has responsible adult to drive home post procedure and observe 24 hours after arriving home:   Currently, due to Covid-19 pandemic, only one support person will be allowed with patient. Must be the same support person for that patient's entire stay, will be screened and required to wear a mask. They will be asked to wait in the waiting room for the duration of the patient's stay.  Patients are required to wear a mask when they enter the hospital.

## 2019-08-26 ENCOUNTER — Other Ambulatory Visit: Payer: Self-pay | Admitting: Cardiology

## 2019-08-26 ENCOUNTER — Ambulatory Visit (HOSPITAL_COMMUNITY)
Admission: RE | Admit: 2019-08-26 | Discharge: 2019-08-26 | Disposition: A | Discharge status: Home / Self Care | Payer: Medicare Other | Attending: Cardiovascular Disease | Admitting: Cardiovascular Disease

## 2019-08-26 ENCOUNTER — Encounter (HOSPITAL_COMMUNITY)
Admission: RE | Disposition: A | Discharge status: Home / Self Care | Payer: Self-pay | Source: Home / Self Care | Attending: Cardiovascular Disease

## 2019-08-26 ENCOUNTER — Other Ambulatory Visit: Payer: Self-pay

## 2019-08-26 DIAGNOSIS — F419 Anxiety disorder, unspecified: Secondary | ICD-10-CM | POA: Insufficient documentation

## 2019-08-26 DIAGNOSIS — I10 Essential (primary) hypertension: Secondary | ICD-10-CM | POA: Insufficient documentation

## 2019-08-26 DIAGNOSIS — Z885 Allergy status to narcotic agent status: Secondary | ICD-10-CM | POA: Insufficient documentation

## 2019-08-26 DIAGNOSIS — I25118 Atherosclerotic heart disease of native coronary artery with other forms of angina pectoris: Secondary | ICD-10-CM

## 2019-08-26 DIAGNOSIS — Z955 Presence of coronary angioplasty implant and graft: Secondary | ICD-10-CM | POA: Insufficient documentation

## 2019-08-26 DIAGNOSIS — Z86711 Personal history of pulmonary embolism: Secondary | ICD-10-CM | POA: Diagnosis not present

## 2019-08-26 DIAGNOSIS — I25119 Atherosclerotic heart disease of native coronary artery with unspecified angina pectoris: Secondary | ICD-10-CM | POA: Insufficient documentation

## 2019-08-26 DIAGNOSIS — K219 Gastro-esophageal reflux disease without esophagitis: Secondary | ICD-10-CM | POA: Insufficient documentation

## 2019-08-26 DIAGNOSIS — I252 Old myocardial infarction: Secondary | ICD-10-CM | POA: Insufficient documentation

## 2019-08-26 DIAGNOSIS — Z8546 Personal history of malignant neoplasm of prostate: Secondary | ICD-10-CM | POA: Insufficient documentation

## 2019-08-26 DIAGNOSIS — Z8589 Personal history of malignant neoplasm of other organs and systems: Secondary | ICD-10-CM | POA: Diagnosis not present

## 2019-08-26 DIAGNOSIS — Z79899 Other long term (current) drug therapy: Secondary | ICD-10-CM | POA: Insufficient documentation

## 2019-08-26 DIAGNOSIS — I4510 Unspecified right bundle-branch block: Secondary | ICD-10-CM | POA: Insufficient documentation

## 2019-08-26 DIAGNOSIS — Z7901 Long term (current) use of anticoagulants: Secondary | ICD-10-CM | POA: Insufficient documentation

## 2019-08-26 DIAGNOSIS — F17290 Nicotine dependence, other tobacco product, uncomplicated: Secondary | ICD-10-CM | POA: Diagnosis not present

## 2019-08-26 HISTORY — PX: LEFT HEART CATH AND CORONARY ANGIOGRAPHY: CATH118249

## 2019-08-26 LAB — PROTIME-INR
INR: 1.8 — ABNORMAL HIGH (ref 0.8–1.2)
Prothrombin Time: 20.4 seconds — ABNORMAL HIGH (ref 11.4–15.2)

## 2019-08-26 SURGERY — LEFT HEART CATH AND CORONARY ANGIOGRAPHY
Anesthesia: LOCAL

## 2019-08-26 MED ORDER — LABETALOL HCL 5 MG/ML IV SOLN: 10.0000 mg | INTRAVENOUS | Status: DC | PRN

## 2019-08-26 MED ORDER — MIDAZOLAM HCL 2 MG/2ML IJ SOLN
INTRAMUSCULAR | Status: AC
  Filled 2019-08-26: qty 2

## 2019-08-26 MED ORDER — HEPARIN SODIUM (PORCINE) 1000 UNIT/ML IJ SOLN
INTRAMUSCULAR | Status: DC | PRN
  Administered 2019-08-26: 4000 [IU] via INTRAVENOUS

## 2019-08-26 MED ORDER — HYDRALAZINE HCL 20 MG/ML IJ SOLN: 10.0000 mg | INTRAMUSCULAR | Status: DC | PRN

## 2019-08-26 MED ORDER — SODIUM CHLORIDE 0.9% FLUSH: 3.0000 mL | INTRAVENOUS | Status: DC | PRN

## 2019-08-26 MED ORDER — MIDAZOLAM HCL 2 MG/2ML IJ SOLN
INTRAMUSCULAR | Status: DC | PRN
  Administered 2019-08-26 (×2): 1 mg via INTRAVENOUS

## 2019-08-26 MED ORDER — VERAPAMIL HCL 2.5 MG/ML IV SOLN
INTRAVENOUS | Status: DC | PRN
  Administered 2019-08-26: 08:00:00 10 mL via INTRA_ARTERIAL

## 2019-08-26 MED ORDER — SODIUM CHLORIDE 0.9 % IV SOLN: 250.0000 mL | INTRAVENOUS | Status: DC | PRN

## 2019-08-26 MED ORDER — LIDOCAINE HCL (PF) 1 % IJ SOLN
INTRAMUSCULAR | Status: DC | PRN
  Administered 2019-08-26: 2 mL

## 2019-08-26 MED ORDER — SODIUM CHLORIDE 0.9 % WEIGHT BASED INFUSION
3.0000 mL/kg/h | INTRAVENOUS | Status: AC
  Administered 2019-08-26: 3 mL/kg/h via INTRAVENOUS

## 2019-08-26 MED ORDER — SODIUM CHLORIDE 0.9% FLUSH: 3.0000 mL | Freq: Two times a day (BID) | INTRAVENOUS | Status: DC

## 2019-08-26 MED ORDER — HYDRALAZINE HCL 20 MG/ML IJ SOLN
INTRAMUSCULAR | Status: DC | PRN
  Administered 2019-08-26: 10 mg via INTRAVENOUS

## 2019-08-26 MED ORDER — FENTANYL CITRATE (PF) 100 MCG/2ML IJ SOLN
INTRAMUSCULAR | Status: DC | PRN
  Administered 2019-08-26: 25 ug via INTRAVENOUS

## 2019-08-26 MED ORDER — FENTANYL CITRATE (PF) 100 MCG/2ML IJ SOLN
INTRAMUSCULAR | Status: AC
  Filled 2019-08-26: qty 2

## 2019-08-26 MED ORDER — LIDOCAINE HCL (PF) 1 % IJ SOLN
INTRAMUSCULAR | Status: AC
  Filled 2019-08-26: qty 30

## 2019-08-26 MED ORDER — SODIUM CHLORIDE 0.9 % IV SOLN: INTRAVENOUS | Status: DC

## 2019-08-26 MED ORDER — IOHEXOL 350 MG/ML SOLN
INTRAVENOUS | Status: DC | PRN
  Administered 2019-08-26: 09:00:00 75 mL

## 2019-08-26 MED ORDER — ACETAMINOPHEN 325 MG PO TABS: 650.0000 mg | ORAL_TABLET | ORAL | Status: DC | PRN

## 2019-08-26 MED ORDER — ASPIRIN 81 MG PO CHEW: 81.0000 mg | CHEWABLE_TABLET | ORAL | Status: DC

## 2019-08-26 MED ORDER — HYDRALAZINE HCL 20 MG/ML IJ SOLN
INTRAMUSCULAR | Status: AC
  Filled 2019-08-26: qty 1

## 2019-08-26 MED ORDER — HEPARIN SODIUM (PORCINE) 1000 UNIT/ML IJ SOLN
INTRAMUSCULAR | Status: AC
  Filled 2019-08-26: qty 1

## 2019-08-26 MED ORDER — ONDANSETRON HCL 4 MG/2ML IJ SOLN: 4.0000 mg | Freq: Four times a day (QID) | INTRAMUSCULAR | Status: DC | PRN

## 2019-08-26 MED ORDER — AMLODIPINE BESYLATE 5 MG PO TABS: 5.0000 mg | ORAL_TABLET | Freq: Every day | ORAL | 1 refills | Status: DC

## 2019-08-26 MED ORDER — ATORVASTATIN CALCIUM 80 MG PO TABS: 80.0000 mg | ORAL_TABLET | Freq: Every day | ORAL | Status: DC

## 2019-08-26 MED ORDER — VERAPAMIL HCL 2.5 MG/ML IV SOLN
INTRAVENOUS | Status: AC
  Filled 2019-08-26: qty 2

## 2019-08-26 MED ORDER — METOPROLOL TARTRATE 25 MG PO TABS: 37.5000 mg | ORAL_TABLET | Freq: Two times a day (BID) | ORAL | 1 refills | Status: DC

## 2019-08-26 MED ORDER — HEPARIN (PORCINE) IN NACL 1000-0.9 UT/500ML-% IV SOLN
INTRAVENOUS | Status: DC | PRN
  Administered 2019-08-26 (×2): 500 mL

## 2019-08-26 MED ORDER — HEPARIN (PORCINE) IN NACL 1000-0.9 UT/500ML-% IV SOLN
INTRAVENOUS | Status: AC
  Filled 2019-08-26: qty 1000

## 2019-08-26 MED ORDER — SODIUM CHLORIDE 0.9 % WEIGHT BASED INFUSION: 1.0000 mL/kg/h | INTRAVENOUS | Status: DC

## 2019-08-26 SURGICAL SUPPLY — 11 items
CATH INFINITI 5 FR JL3.5 (CATHETERS) ×2 IMPLANT
CATH INFINITI JR4 5F (CATHETERS) ×4 IMPLANT
CATH OPTITORQUE TIG 4.0 5F (CATHETERS) ×2 IMPLANT
DEVICE RAD COMP TR BAND LRG (VASCULAR PRODUCTS) ×2 IMPLANT
GLIDESHEATH SLEND SS 6F .021 (SHEATH) ×2 IMPLANT
GUIDEWIRE INQWIRE 1.5J.035X260 (WIRE) ×1 IMPLANT
INQWIRE 1.5J .035X260CM (WIRE) ×2
KIT HEART LEFT (KITS) ×2 IMPLANT
PACK CARDIAC CATHETERIZATION (CUSTOM PROCEDURE TRAY) ×2 IMPLANT
TRANSDUCER W/STOPCOCK (MISCELLANEOUS) ×2 IMPLANT
TUBING CIL FLEX 10 FLL-RA (TUBING) ×2 IMPLANT

## 2019-08-26 NOTE — Discharge Instructions (Signed)
Resume warfarin tomorrow. Add amlodipine 5 mg daily.  May need to increase metoprolol to 37.5 mg twice daily.       Radial Site Care  This sheet gives you information about how to care for yourself after your procedure. Your health care provider may also give you more specific instructions. If you have problems or questions, contact your health care provider. What can I expect after the procedure? After the procedure, it is common to have:  Bruising and tenderness at the catheter insertion area. Follow these instructions at home: Medicines  Take over-the-counter and prescription medicines only as told by your health care provider. Insertion site care  Follow instructions from your health care provider about how to take care of your insertion site. Make sure you: ? Wash your hands with soap and water before you change your bandage (dressing). If soap and water are not available, use hand sanitizer. ? Change your dressing as told by your health care provider. ? Leave stitches (sutures), skin glue, or adhesive strips in place. These skin closures may need to stay in place for 2 weeks or longer. If adhesive strip edges start to loosen and curl up, you may trim the loose edges. Do not remove adhesive strips completely unless your health care provider tells you to do that.  Check your insertion site every day for signs of infection. Check for: ? Redness, swelling, or pain. ? Fluid or blood. ? Pus or a bad smell. ? Warmth.  Do not take baths, swim, or use a hot tub until your health care provider approves.  You may shower 24-48 hours after the procedure, or as directed by your health care provider. ? Remove the dressing and gently wash the site with plain soap and water. ? Pat the area dry with a clean towel. ? Do not rub the site. That could cause bleeding.  Do not apply powder or lotion to the site. Activity   For 24 hours after the procedure, or as directed by your health care  provider: ? Do not flex or bend the affected arm. ? Do not push or pull heavy objects with the affected arm. ? Do not drive yourself home from the hospital or clinic. You may drive 24 hours after the procedure unless your health care provider tells you not to. ? Do not operate machinery or power tools.  Do not lift anything that is heavier than 10 lb (4.5 kg), or the limit that you are told, until your health care provider says that it is safe.  Ask your health care provider when it is okay to: ? Return to work or school. ? Resume usual physical activities or sports. ? Resume sexual activity. General instructions  If the catheter site starts to bleed, raise your arm and put firm pressure on the site. If the bleeding does not stop, get help right away. This is a medical emergency.  If you went home on the same day as your procedure, a responsible adult should be with you for the first 24 hours after you arrive home.  Keep all follow-up visits as told by your health care provider. This is important. Contact a health care provider if:  You have a fever.  You have redness, swelling, or yellow drainage around your insertion site. Get help right away if:  You have unusual pain at the radial site.  The catheter insertion area swells very fast.  The insertion area is bleeding, and the bleeding does not stop when  you hold steady pressure on the area.  Your arm or hand becomes pale, cool, tingly, or numb. These symptoms may represent a serious problem that is an emergency. Do not wait to see if the symptoms will go away. Get medical help right away. Call your local emergency services (911 in the [place] Do not drive yourself to the hospital. Summary  After the procedure, it is common to have bruising and tenderness at the site.  Follow instructions from your health care provider about how to take care of your radial site wound. Check the wound every day for signs of infection.  Do not  lift anything that is heavier than 10 lb (4.5 kg), or the limit that you are told, until your health care provider says that it is safe. This information is not intended to replace advice given to you by your health care provider. Make sure you discuss any questions you have with your health care provider. Document Revised: 09/03/2017 Document Reviewed: 09/03/2017 Elsevier Patient Education  2020 [employer].

## 2019-08-26 NOTE — Progress Notes (Signed)
Pt states his wife is handicapped and wheelchair bound and would be unable to hold pressure if pt were to bleed.  His daughter cannot come from out of town until tomorrow. Dr Claiborne Billings made aware.

## 2019-08-26 NOTE — Progress Notes (Signed)
Spoke with Pennie Banter, wife regarding patient going home today.  She states she is wheelchair bound, she is sufficient in caring for herself.  She is able to call 911 if needed, she is also able to call Almyra Free, a neighbor who lives around the corner that helps care for her if she needed anything.  Will inform Dr Claiborne Billings.

## 2019-08-26 NOTE — Progress Notes (Signed)
Have attempted to get in touch with pt's daughter without success.  Pt states she lives out of town and is at work and could not make plans with this short of notice.  Pt's transportation can not spend the night.  Pt wishes to stay overnight.  Gwynne Edinger notified

## 2019-08-26 NOTE — Interval H&P Note (Signed)
Cath Lab Visit (complete for each Cath Lab visit)  Clinical Evaluation Leading to the Procedure:   ACS: No.  Non-ACS:    Anginal Classification: CCS III  Anti-ischemic medical therapy: Minimal Therapy (1 class of medications)  Non-Invasive Test Results: No non-invasive testing performed  Prior CABG: No previous CABG      History and Physical Interval Note:  08/26/2019 7:37 AM  Anthony Dodson  has presented today for surgery, with the diagnosis of abnormal stress test.  The various methods of treatment have been discussed with the patient and family. After consideration of risks, benefits and other options for treatment, the patient has consented to  Procedure(s): LEFT HEART CATH AND CORONARY ANGIOGRAPHY (N/A) as a surgical intervention.  The patient's history has been reviewed, patient examined, no change in status, stable for surgery.  I have reviewed the patient's chart and labs.  Questions were answered to the patient's satisfaction.     Shelva Majestic

## 2019-10-22 ENCOUNTER — Ambulatory Visit: Payer: Medicare Other | Admitting: Cardiovascular Disease

## 2019-12-25 ENCOUNTER — Other Ambulatory Visit: Payer: Self-pay | Admitting: Cardiology

## 2019-12-27 ENCOUNTER — Other Ambulatory Visit: Payer: Self-pay

## 2019-12-27 MED ORDER — METOPROLOL TARTRATE 25 MG PO TABS: ORAL_TABLET | ORAL | 0 refills | Status: DC

## 2019-12-27 NOTE — Telephone Encounter (Signed)
This is a [place] pt, Dr. Koneswaran.  °

## 2020-04-03 ENCOUNTER — Other Ambulatory Visit: Payer: Self-pay | Admitting: *Deleted

## 2020-04-03 MED ORDER — METOPROLOL TARTRATE 25 MG PO TABS: ORAL_TABLET | ORAL | 0 refills | Status: AC

## 2020-05-16 ENCOUNTER — Telehealth: Payer: Self-pay

## 2020-05-16 NOTE — Telephone Encounter (Signed)
lmom for overdue inr 

## 2020-10-25 ENCOUNTER — Ambulatory Visit: Payer: Self-pay | Admitting: *Deleted

## 2021-06-12 DEATH — deceased
